# Patient Record
Sex: Male | Born: 1935 | Race: Black or African American | Hispanic: No | Smoking: Former smoker
Health system: Southern US, Community
[De-identification: ages and names within clinical notes are randomized; demographics above are authoritative.]

## PROBLEM LIST (undated history)

## (undated) DIAGNOSIS — E119 Type 2 diabetes mellitus without complications: Secondary | ICD-10-CM

## (undated) DIAGNOSIS — I10 Essential (primary) hypertension: Secondary | ICD-10-CM

## (undated) DIAGNOSIS — Z973 Presence of spectacles and contact lenses: Secondary | ICD-10-CM

---

## 1997-12-06 ENCOUNTER — Other Ambulatory Visit: Admission: RE | Admit: 1997-12-06 | Discharge: 1997-12-06 | Payer: Self-pay | Admitting: Family Medicine

## 2002-03-16 ENCOUNTER — Emergency Department (HOSPITAL_COMMUNITY): Admission: EM | Admit: 2002-03-16 | Discharge: 2002-03-16 | Payer: Self-pay | Admitting: Emergency Medicine

## 2014-07-31 ENCOUNTER — Emergency Department (HOSPITAL_COMMUNITY): Payer: Medicare Other

## 2014-07-31 ENCOUNTER — Encounter (HOSPITAL_COMMUNITY): Payer: Self-pay | Admitting: *Deleted

## 2014-07-31 ENCOUNTER — Inpatient Hospital Stay (HOSPITAL_COMMUNITY)
Admission: EM | Admit: 2014-07-31 | Discharge: 2014-08-06 | DRG: 480 | Disposition: A | Discharge status: Skilled Nursing Facility | Payer: Medicare Other | Attending: Internal Medicine | Admitting: Internal Medicine

## 2014-07-31 DIAGNOSIS — S72121A Displaced fracture of lesser trochanter of right femur, initial encounter for closed fracture: Secondary | ICD-10-CM | POA: Diagnosis not present

## 2014-07-31 DIAGNOSIS — E43 Unspecified severe protein-calorie malnutrition: Secondary | ICD-10-CM | POA: Diagnosis present

## 2014-07-31 DIAGNOSIS — W010XXA Fall on same level from slipping, tripping and stumbling without subsequent striking against object, initial encounter: Secondary | ICD-10-CM | POA: Diagnosis present

## 2014-07-31 DIAGNOSIS — D62 Acute posthemorrhagic anemia: Secondary | ICD-10-CM | POA: Diagnosis not present

## 2014-07-31 DIAGNOSIS — E876 Hypokalemia: Secondary | ICD-10-CM | POA: Diagnosis not present

## 2014-07-31 DIAGNOSIS — D649 Anemia, unspecified: Secondary | ICD-10-CM | POA: Diagnosis present

## 2014-07-31 DIAGNOSIS — I10 Essential (primary) hypertension: Secondary | ICD-10-CM | POA: Diagnosis present

## 2014-07-31 DIAGNOSIS — S7223XA Displaced subtrochanteric fracture of unspecified femur, initial encounter for closed fracture: Secondary | ICD-10-CM | POA: Diagnosis not present

## 2014-07-31 DIAGNOSIS — S72009A Fracture of unspecified part of neck of unspecified femur, initial encounter for closed fracture: Secondary | ICD-10-CM | POA: Diagnosis present

## 2014-07-31 DIAGNOSIS — S7291XA Unspecified fracture of right femur, initial encounter for closed fracture: Secondary | ICD-10-CM

## 2014-07-31 DIAGNOSIS — S72001A Fracture of unspecified part of neck of right femur, initial encounter for closed fracture: Secondary | ICD-10-CM

## 2014-07-31 DIAGNOSIS — W19XXXA Unspecified fall, initial encounter: Secondary | ICD-10-CM

## 2014-07-31 DIAGNOSIS — Z87891 Personal history of nicotine dependence: Secondary | ICD-10-CM

## 2014-07-31 DIAGNOSIS — E119 Type 2 diabetes mellitus without complications: Secondary | ICD-10-CM

## 2014-07-31 DIAGNOSIS — N390 Urinary tract infection, site not specified: Secondary | ICD-10-CM | POA: Diagnosis present

## 2014-07-31 HISTORY — DX: Essential (primary) hypertension: I10

## 2014-07-31 HISTORY — DX: Type 2 diabetes mellitus without complications: E11.9

## 2014-07-31 NOTE — ED Notes (Addendum)
Pt reports walking to mailbox when his shoe came off and he fell. Pt reports falling on R hip. Rotation noted. Pulses present

## 2014-07-31 NOTE — ED Notes (Signed)
Pt to ED from home via [facility] c/o R hip pain. Pt walked outside and tripped over his shoe. Pt fell onto R hip and felt something give. EMS gave fentanyl enroute. Pt hypertensive on arrive. Pt states he forgot to take his medications this morning so he took his blood pressure medications at 6pm

## 2014-08-01 ENCOUNTER — Encounter (HOSPITAL_COMMUNITY): Payer: Self-pay | Admitting: Internal Medicine

## 2014-08-01 ENCOUNTER — Encounter (HOSPITAL_COMMUNITY)
Admission: EM | Disposition: A | Discharge status: Skilled Nursing Facility | Payer: Self-pay | Source: Home / Self Care | Attending: Internal Medicine

## 2014-08-01 ENCOUNTER — Inpatient Hospital Stay (HOSPITAL_COMMUNITY): Payer: Medicare Other | Admitting: Certified Registered Nurse Anesthetist

## 2014-08-01 ENCOUNTER — Inpatient Hospital Stay (HOSPITAL_COMMUNITY): Payer: Medicare Other

## 2014-08-01 ENCOUNTER — Emergency Department (HOSPITAL_COMMUNITY): Payer: Medicare Other

## 2014-08-01 DIAGNOSIS — E876 Hypokalemia: Secondary | ICD-10-CM | POA: Diagnosis not present

## 2014-08-01 DIAGNOSIS — E43 Unspecified severe protein-calorie malnutrition: Secondary | ICD-10-CM | POA: Diagnosis present

## 2014-08-01 DIAGNOSIS — Z87891 Personal history of nicotine dependence: Secondary | ICD-10-CM | POA: Diagnosis not present

## 2014-08-01 DIAGNOSIS — N390 Urinary tract infection, site not specified: Secondary | ICD-10-CM | POA: Diagnosis present

## 2014-08-01 DIAGNOSIS — S72001A Fracture of unspecified part of neck of right femur, initial encounter for closed fracture: Secondary | ICD-10-CM

## 2014-08-01 DIAGNOSIS — S72121A Displaced fracture of lesser trochanter of right femur, initial encounter for closed fracture: Secondary | ICD-10-CM | POA: Diagnosis present

## 2014-08-01 DIAGNOSIS — I10 Essential (primary) hypertension: Secondary | ICD-10-CM | POA: Diagnosis present

## 2014-08-01 DIAGNOSIS — S72009A Fracture of unspecified part of neck of unspecified femur, initial encounter for closed fracture: Secondary | ICD-10-CM | POA: Diagnosis present

## 2014-08-01 DIAGNOSIS — S7223XA Displaced subtrochanteric fracture of unspecified femur, initial encounter for closed fracture: Secondary | ICD-10-CM | POA: Diagnosis present

## 2014-08-01 DIAGNOSIS — D62 Acute posthemorrhagic anemia: Secondary | ICD-10-CM | POA: Diagnosis not present

## 2014-08-01 DIAGNOSIS — W010XXA Fall on same level from slipping, tripping and stumbling without subsequent striking against object, initial encounter: Secondary | ICD-10-CM | POA: Diagnosis present

## 2014-08-01 DIAGNOSIS — E119 Type 2 diabetes mellitus without complications: Secondary | ICD-10-CM | POA: Diagnosis present

## 2014-08-01 HISTORY — PX: FEMUR IM NAIL: SHX1597

## 2014-08-01 HISTORY — PX: HIP FRACTURE SURGERY: SHX118

## 2014-08-01 LAB — COMPREHENSIVE METABOLIC PANEL
ALBUMIN: 3.5 g/dL (ref 3.5–5.2)
ALK PHOS: 127 U/L — AB (ref 39–117)
ALT: 27 U/L (ref 0–53)
ALT: 28 U/L (ref 0–53)
ANION GAP: 15 (ref 5–15)
AST: 36 U/L (ref 0–37)
AST: 32 U/L (ref 0–37)
Albumin: 3.8 g/dL (ref 3.5–5.2)
Alkaline Phosphatase: 144 U/L — ABNORMAL HIGH (ref 39–117)
Anion gap: 16 — ABNORMAL HIGH (ref 5–15)
BILIRUBIN TOTAL: 0.4 mg/dL (ref 0.3–1.2)
BUN: 16 mg/dL (ref 6–23)
BUN: 17 mg/dL (ref 6–23)
CALCIUM: 9.6 mg/dL (ref 8.4–10.5)
CHLORIDE: 90 meq/L — AB (ref 96–112)
CO2: 27 mEq/L (ref 19–32)
CO2: 28 meq/L (ref 19–32)
Calcium: 9.4 mg/dL (ref 8.4–10.5)
Chloride: 89 mEq/L — ABNORMAL LOW (ref 96–112)
Creatinine, Ser: 1.13 mg/dL (ref 0.50–1.35)
Creatinine, Ser: 1.09 mg/dL (ref 0.50–1.35)
GFR calc Af Amer: 65 mL/min — ABNORMAL LOW (ref >90)
GFR calc non Af Amer: 56 mL/min — ABNORMAL LOW (ref >90)
GFR calc non Af Amer: 59 mL/min — ABNORMAL LOW (ref >90)
GFR, EST AFRICAN AMERICAN: 68 mL/min — AB (ref >90)
GLUCOSE: 215 mg/dL — AB (ref 70–99)
Glucose, Bld: 211 mg/dL — ABNORMAL HIGH (ref 70–99)
POTASSIUM: 3.8 meq/L (ref 3.7–5.3)
POTASSIUM: 4.2 meq/L (ref 3.7–5.3)
SODIUM: 133 meq/L — AB (ref 137–147)
Sodium: 132 mEq/L — ABNORMAL LOW (ref 137–147)
TOTAL PROTEIN: 7.6 g/dL (ref 6.0–8.3)
Total Bilirubin: 0.4 mg/dL (ref 0.3–1.2)
Total Protein: 7 g/dL (ref 6.0–8.3)

## 2014-08-01 LAB — SURGICAL PCR SCREEN
MRSA, PCR: NEGATIVE
Staphylococcus aureus: NEGATIVE

## 2014-08-01 LAB — URINALYSIS, ROUTINE W REFLEX MICROSCOPIC
Bilirubin Urine: NEGATIVE
Glucose, UA: 100 mg/dL — AB
Ketones, ur: 40 mg/dL — AB
Nitrite: NEGATIVE
Protein, ur: NEGATIVE mg/dL
Specific Gravity, Urine: 1.013 (ref 1.005–1.030)
Urobilinogen, UA: 0.2 mg/dL (ref 0.0–1.0)
pH: 6.5 (ref 5.0–8.0)

## 2014-08-01 LAB — URINE MICROSCOPIC-ADD ON

## 2014-08-01 LAB — CBC WITH DIFFERENTIAL/PLATELET
Basophils Absolute: 0 10*3/uL (ref 0.0–0.1)
Basophils Absolute: 0 10*3/uL (ref 0.0–0.1)
Basophils Relative: 0 % (ref 0–1)
Basophils Relative: 0 % (ref 0–1)
EOS ABS: 0 10*3/uL (ref 0.0–0.7)
Eosinophils Absolute: 0 10*3/uL (ref 0.0–0.7)
Eosinophils Relative: 0 % (ref 0–5)
Eosinophils Relative: 0 % (ref 0–5)
HCT: 40.4 % (ref 39.0–52.0)
HCT: 44.6 % (ref 39.0–52.0)
Hemoglobin: 13.4 g/dL (ref 13.0–17.0)
Hemoglobin: 15.3 g/dL (ref 13.0–17.0)
LYMPHS ABS: 0.9 10*3/uL (ref 0.7–4.0)
LYMPHS PCT: 5 % — AB (ref 12–46)
Lymphocytes Relative: 6 % — ABNORMAL LOW (ref 12–46)
Lymphs Abs: 1.1 10*3/uL (ref 0.7–4.0)
MCH: 30.5 pg (ref 26.0–34.0)
MCH: 31.4 pg (ref 26.0–34.0)
MCHC: 33.2 g/dL (ref 30.0–36.0)
MCHC: 34.3 g/dL (ref 30.0–36.0)
MCV: 91.6 fL (ref 78.0–100.0)
MCV: 92 fL (ref 78.0–100.0)
Monocytes Absolute: 0.7 10*3/uL (ref 0.1–1.0)
Monocytes Absolute: 1.1 10*3/uL — ABNORMAL HIGH (ref 0.1–1.0)
Monocytes Relative: 4 % (ref 3–12)
Monocytes Relative: 7 % (ref 3–12)
NEUTROS PCT: 88 % — AB (ref 43–77)
Neutro Abs: 15.1 10*3/uL — ABNORMAL HIGH (ref 1.7–7.7)
Neutro Abs: 16 10*3/uL — ABNORMAL HIGH (ref 1.7–7.7)
Neutrophils Relative %: 90 % — ABNORMAL HIGH (ref 43–77)
PLATELETS: 205 10*3/uL (ref 150–400)
Platelets: 218 10*3/uL (ref 150–400)
RBC: 4.39 MIL/uL (ref 4.22–5.81)
RBC: 4.87 MIL/uL (ref 4.22–5.81)
RDW: 12.8 % (ref 11.5–15.5)
RDW: 12.9 % (ref 11.5–15.5)
WBC Morphology: INCREASED
WBC: 17.2 10*3/uL — AB (ref 4.0–10.5)
WBC: 17.8 10*3/uL — ABNORMAL HIGH (ref 4.0–10.5)

## 2014-08-01 LAB — GLUCOSE, CAPILLARY
Glucose-Capillary: 219 mg/dL — ABNORMAL HIGH (ref 70–99)
Glucose-Capillary: 189 mg/dL — ABNORMAL HIGH (ref 70–99)
Glucose-Capillary: 150 mg/dL — ABNORMAL HIGH (ref 70–99)
Glucose-Capillary: 170 mg/dL — ABNORMAL HIGH (ref 70–99)

## 2014-08-01 LAB — ABO/RH: ABO/RH(D): O POS

## 2014-08-01 LAB — VITAMIN D 25 HYDROXY (VIT D DEFICIENCY, FRACTURES): Vit D, 25-Hydroxy: 22 ng/mL — ABNORMAL LOW (ref 30–100)

## 2014-08-01 LAB — APTT: aPTT: 25 seconds (ref 24–37)

## 2014-08-01 LAB — PROTIME-INR
INR: 1.03 (ref 0.00–1.49)
Prothrombin Time: 13.7 s (ref 11.6–15.2)

## 2014-08-01 SURGERY — INSERTION, INTRAMEDULLARY ROD, FEMUR
Anesthesia: General | Site: Hip | Laterality: Right

## 2014-08-01 MED ORDER — METOCLOPRAMIDE HCL 10 MG PO TABS: 5.0000 mg | ORAL_TABLET | Freq: Three times a day (TID) | ORAL | Status: DC | PRN

## 2014-08-01 MED ORDER — CEFAZOLIN SODIUM-DEXTROSE 2-3 GM-% IV SOLR
INTRAVENOUS | Status: AC
  Filled 2014-08-01: qty 50

## 2014-08-01 MED ORDER — LIDOCAINE HCL (CARDIAC) 20 MG/ML IV SOLN
INTRAVENOUS | Status: AC
  Filled 2014-08-01: qty 5

## 2014-08-01 MED ORDER — HYDROCODONE-ACETAMINOPHEN 5-325 MG PO TABS
1.0000 | ORAL_TABLET | Freq: Four times a day (QID) | ORAL | Status: DC | PRN
  Administered 2014-08-02: 1 via ORAL
  Filled 2014-08-01: qty 1

## 2014-08-01 MED ORDER — ONDANSETRON HCL 4 MG PO TABS: 4.0000 mg | ORAL_TABLET | Freq: Four times a day (QID) | ORAL | Status: DC | PRN

## 2014-08-01 MED ORDER — NEOSTIGMINE METHYLSULFATE 10 MG/10ML IV SOLN
INTRAVENOUS | Status: AC
  Filled 2014-08-01: qty 1

## 2014-08-01 MED ORDER — INSULIN ASPART 100 UNIT/ML ~~LOC~~ SOLN
0.0000 [IU] | SUBCUTANEOUS | Status: DC
  Administered 2014-08-01 (×2): 3 [IU] via SUBCUTANEOUS
  Administered 2014-08-01 – 2014-08-02 (×4): 2 [IU] via SUBCUTANEOUS

## 2014-08-01 MED ORDER — PHENOL 1.4 % MT LIQD: 1.0000 | OROMUCOSAL | Status: DC | PRN

## 2014-08-01 MED ORDER — GLYCOPYRROLATE 0.2 MG/ML IJ SOLN
INTRAMUSCULAR | Status: AC
  Filled 2014-08-01: qty 3

## 2014-08-01 MED ORDER — OXYCODONE HCL 5 MG/5ML PO SOLN: 5.0000 mg | Freq: Once | ORAL | Status: DC | PRN

## 2014-08-01 MED ORDER — SODIUM CHLORIDE 0.9 % IV SOLN
INTRAVENOUS | Status: AC
  Administered 2014-08-01: 07:00:00 via INTRAVENOUS

## 2014-08-01 MED ORDER — LACTATED RINGERS IV SOLN: INTRAVENOUS | Status: DC

## 2014-08-01 MED ORDER — FENTANYL CITRATE 0.05 MG/ML IJ SOLN
INTRAMUSCULAR | Status: DC | PRN
  Administered 2014-08-01: 100 ug via INTRAVENOUS

## 2014-08-01 MED ORDER — MAGNESIUM CITRATE PO SOLN: 1.0000 | Freq: Once | ORAL | Status: AC | PRN

## 2014-08-01 MED ORDER — MORPHINE SULFATE 2 MG/ML IJ SOLN
0.5000 mg | INTRAMUSCULAR | Status: DC | PRN
  Administered 2014-08-01: 0.5 mg via INTRAVENOUS
  Filled 2014-08-01: qty 1

## 2014-08-01 MED ORDER — MORPHINE SULFATE 2 MG/ML IJ SOLN: 0.5000 mg | INTRAMUSCULAR | Status: DC | PRN

## 2014-08-01 MED ORDER — CHLORHEXIDINE GLUCONATE 4 % EX LIQD
60.0000 mL | Freq: Once | CUTANEOUS | Status: DC
  Filled 2014-08-01: qty 60

## 2014-08-01 MED ORDER — OXYCODONE HCL 5 MG PO TABS
5.0000 mg | ORAL_TABLET | ORAL | Status: DC | PRN
  Administered 2014-08-06: 10 mg via ORAL
  Filled 2014-08-01 (×3): qty 2

## 2014-08-01 MED ORDER — LACTATED RINGERS IV SOLN
INTRAVENOUS | Status: DC | PRN
  Administered 2014-08-01 (×2): via INTRAVENOUS

## 2014-08-01 MED ORDER — HYDROMORPHONE HCL 1 MG/ML IJ SOLN
0.5000 mg | INTRAMUSCULAR | Status: DC | PRN
Start: 2014-08-01 — End: 2014-08-01

## 2014-08-01 MED ORDER — ACETAMINOPHEN 650 MG RE SUPP: 650.0000 mg | Freq: Four times a day (QID) | RECTAL | Status: DC | PRN

## 2014-08-01 MED ORDER — OXYCODONE HCL 5 MG PO TABS: 5.0000 mg | ORAL_TABLET | ORAL | Status: DC | PRN

## 2014-08-01 MED ORDER — SODIUM CHLORIDE 0.9 % IV SOLN
INTRAVENOUS | Status: DC
  Administered 2014-08-01: 23:00:00 via INTRAVENOUS

## 2014-08-01 MED ORDER — CEFAZOLIN SODIUM-DEXTROSE 2-3 GM-% IV SOLR
2.0000 g | Freq: Four times a day (QID) | INTRAVENOUS | Status: AC
  Administered 2014-08-01 – 2014-08-02 (×3): 2 g via INTRAVENOUS
  Filled 2014-08-01 (×4): qty 50

## 2014-08-01 MED ORDER — HYDROCODONE-ACETAMINOPHEN 5-325 MG PO TABS: 1.0000 | ORAL_TABLET | Freq: Four times a day (QID) | ORAL | Status: DC | PRN

## 2014-08-01 MED ORDER — SORBITOL 70 % SOLN
30.0000 mL | Freq: Every day | Status: DC | PRN
  Administered 2014-08-06: 30 mL via ORAL
  Filled 2014-08-01: qty 30

## 2014-08-01 MED ORDER — LACTATED RINGERS IV SOLN
INTRAVENOUS | Status: DC
  Administered 2014-08-01: 13:00:00 via INTRAVENOUS

## 2014-08-01 MED ORDER — SENNA 8.6 MG PO TABS
1.0000 | ORAL_TABLET | Freq: Two times a day (BID) | ORAL | Status: DC
  Administered 2014-08-01 – 2014-08-06 (×10): 8.6 mg via ORAL
  Filled 2014-08-01 (×11): qty 1

## 2014-08-01 MED ORDER — SODIUM CHLORIDE 0.9 % IV SOLN: INTRAVENOUS | Status: DC

## 2014-08-01 MED ORDER — DEXTROSE 5 % IV SOLN
500.0000 mg | Freq: Four times a day (QID) | INTRAVENOUS | Status: DC | PRN
  Filled 2014-08-01: qty 5

## 2014-08-01 MED ORDER — PROPOFOL 10 MG/ML IV BOLUS
INTRAVENOUS | Status: AC
  Filled 2014-08-01: qty 20

## 2014-08-01 MED ORDER — NEOSTIGMINE METHYLSULFATE 10 MG/10ML IV SOLN
INTRAVENOUS | Status: DC | PRN
  Administered 2014-08-01: 4 mg via INTRAVENOUS

## 2014-08-01 MED ORDER — LIDOCAINE HCL (CARDIAC) 20 MG/ML IV SOLN
INTRAVENOUS | Status: DC | PRN
  Administered 2014-08-01: 90 mg via INTRAVENOUS

## 2014-08-01 MED ORDER — ENOXAPARIN SODIUM 40 MG/0.4ML ~~LOC~~ SOLN
40.0000 mg | SUBCUTANEOUS | Status: DC
  Administered 2014-08-02 – 2014-08-06 (×5): 40 mg via SUBCUTANEOUS
  Filled 2014-08-01 (×6): qty 0.4

## 2014-08-01 MED ORDER — KETOROLAC TROMETHAMINE 30 MG/ML IJ SOLN
30.0000 mg | Freq: Four times a day (QID) | INTRAMUSCULAR | Status: AC | PRN
Start: 2014-08-01 — End: 2014-08-03

## 2014-08-01 MED ORDER — PROMETHAZINE HCL 25 MG/ML IJ SOLN: 6.2500 mg | INTRAMUSCULAR | Status: DC | PRN

## 2014-08-01 MED ORDER — MORPHINE SULFATE 2 MG/ML IJ SOLN: 1.0000 mg | INTRAMUSCULAR | Status: DC | PRN

## 2014-08-01 MED ORDER — ONDANSETRON HCL 4 MG/2ML IJ SOLN
INTRAMUSCULAR | Status: DC | PRN
  Administered 2014-08-01: 4 mg via INTRAVENOUS

## 2014-08-01 MED ORDER — METOPROLOL TARTRATE 1 MG/ML IV SOLN
2.5000 mg | Freq: Three times a day (TID) | INTRAVENOUS | Status: DC
  Administered 2014-08-01 (×2): 2.5 mg via INTRAVENOUS
  Filled 2014-08-01 (×7): qty 5

## 2014-08-01 MED ORDER — CEFAZOLIN SODIUM-DEXTROSE 2-3 GM-% IV SOLR
INTRAVENOUS | Status: DC | PRN
  Administered 2014-08-01: 2 g via INTRAVENOUS

## 2014-08-01 MED ORDER — POLYETHYLENE GLYCOL 3350 17 G PO PACK
17.0000 g | PACK | Freq: Every day | ORAL | Status: DC | PRN
Start: 2014-08-01 — End: 2014-08-02

## 2014-08-01 MED ORDER — HYDROMORPHONE HCL 1 MG/ML IJ SOLN
0.2500 mg | INTRAMUSCULAR | Status: DC | PRN
  Administered 2014-08-01: 0.25 mg via INTRAVENOUS

## 2014-08-01 MED ORDER — METOCLOPRAMIDE HCL 5 MG/ML IJ SOLN: 5.0000 mg | Freq: Three times a day (TID) | INTRAMUSCULAR | Status: DC | PRN

## 2014-08-01 MED ORDER — ONDANSETRON HCL 4 MG/2ML IJ SOLN
INTRAMUSCULAR | Status: AC
  Filled 2014-08-01: qty 2

## 2014-08-01 MED ORDER — SODIUM CHLORIDE 0.9 % IV SOLN
Freq: Once | INTRAVENOUS | Status: AC
  Administered 2014-08-01: 75 mL/h via INTRAVENOUS

## 2014-08-01 MED ORDER — ROCURONIUM BROMIDE 100 MG/10ML IV SOLN
INTRAVENOUS | Status: DC | PRN
  Administered 2014-08-01: 40 mg via INTRAVENOUS

## 2014-08-01 MED ORDER — ACETAMINOPHEN 325 MG PO TABS
650.0000 mg | ORAL_TABLET | Freq: Four times a day (QID) | ORAL | Status: DC | PRN
Start: 2014-08-01 — End: 2014-08-06
  Administered 2014-08-03 – 2014-08-05 (×4): 650 mg via ORAL
  Filled 2014-08-01 (×5): qty 2

## 2014-08-01 MED ORDER — ONDANSETRON HCL 4 MG/2ML IJ SOLN: 4.0000 mg | Freq: Four times a day (QID) | INTRAMUSCULAR | Status: DC | PRN

## 2014-08-01 MED ORDER — METHOCARBAMOL 500 MG PO TABS
500.0000 mg | ORAL_TABLET | Freq: Four times a day (QID) | ORAL | Status: DC | PRN
  Administered 2014-08-02: 500 mg via ORAL
  Filled 2014-08-01: qty 1

## 2014-08-01 MED ORDER — CEFAZOLIN SODIUM-DEXTROSE 2-3 GM-% IV SOLR
2.0000 g | INTRAVENOUS | Status: AC
  Administered 2014-08-01: 2 g via INTRAVENOUS
  Filled 2014-08-01 (×2): qty 50

## 2014-08-01 MED ORDER — ALUM & MAG HYDROXIDE-SIMETH 200-200-20 MG/5ML PO SUSP: 30.0000 mL | ORAL | Status: DC | PRN

## 2014-08-01 MED ORDER — MENTHOL 3 MG MT LOZG
1.0000 | LOZENGE | OROMUCOSAL | Status: DC | PRN
  Administered 2014-08-02: 3 mg via ORAL
  Filled 2014-08-01: qty 9

## 2014-08-01 MED ORDER — GLYCOPYRROLATE 0.2 MG/ML IJ SOLN
INTRAMUSCULAR | Status: DC | PRN
  Administered 2014-08-01: 0.6 mg via INTRAVENOUS

## 2014-08-01 MED ORDER — DIAZEPAM 5 MG PO TABS
5.0000 mg | ORAL_TABLET | Freq: Four times a day (QID) | ORAL | Status: DC | PRN
  Administered 2014-08-01: 5 mg via ORAL
  Filled 2014-08-01: qty 1

## 2014-08-01 MED ORDER — FENTANYL CITRATE 0.05 MG/ML IJ SOLN
INTRAMUSCULAR | Status: AC
  Filled 2014-08-01: qty 5

## 2014-08-01 MED ORDER — 0.9 % SODIUM CHLORIDE (POUR BTL) OPTIME
TOPICAL | Status: DC | PRN
  Administered 2014-08-01: 1000 mL

## 2014-08-01 MED ORDER — ENOXAPARIN SODIUM 40 MG/0.4ML ~~LOC~~ SOLN: 40.0000 mg | Freq: Every day | SUBCUTANEOUS | Status: DC

## 2014-08-01 MED ORDER — ROCURONIUM BROMIDE 50 MG/5ML IV SOLN
INTRAVENOUS | Status: AC
  Filled 2014-08-01: qty 1

## 2014-08-01 MED ORDER — HYDROMORPHONE HCL 1 MG/ML IJ SOLN
INTRAMUSCULAR | Status: AC
  Filled 2014-08-01: qty 1

## 2014-08-01 MED ORDER — PHENYLEPHRINE HCL 10 MG/ML IJ SOLN
10.0000 mg | INTRAVENOUS | Status: DC | PRN
  Administered 2014-08-01: 20 ug/min via INTRAVENOUS

## 2014-08-01 MED ORDER — PROPOFOL 10 MG/ML IV BOLUS
INTRAVENOUS | Status: DC | PRN
  Administered 2014-08-01: 110 mg via INTRAVENOUS

## 2014-08-01 MED ORDER — OXYCODONE HCL 5 MG PO TABS: 5.0000 mg | ORAL_TABLET | Freq: Once | ORAL | Status: DC | PRN

## 2014-08-01 SURGICAL SUPPLY — 55 items
BIT DRILL LONG 4.0MM (BIT) IMPLANT
BIT DRILL SHORT 4.0 (BIT) IMPLANT
BNDG COHESIVE 4X5 TAN STRL (GAUZE/BANDAGES/DRESSINGS) ×2 IMPLANT
COVER PERINEAL POST (MISCELLANEOUS) ×2 IMPLANT
COVER SURGICAL LIGHT HANDLE (MISCELLANEOUS) ×2 IMPLANT
DRAPE C-ARM 42X72 X-RAY (DRAPES) ×2 IMPLANT
DRAPE C-ARMOR (DRAPES) ×2 IMPLANT
DRAPE INCISE IOBAN 66X45 STRL (DRAPES) ×2 IMPLANT
DRAPE ORTHO SPLIT 77X108 STRL (DRAPES) ×4
DRAPE PROXIMA HALF (DRAPES) ×4 IMPLANT
DRAPE SURG ORHT 6 SPLT 77X108 (DRAPES) ×2 IMPLANT
DRAPE U-SHAPE 47X51 STRL (DRAPES) ×2 IMPLANT
DRILL BIT LONG 4.0MM (BIT) ×2
DRILL BIT SHORT 4.0 (BIT) ×2
DRILL STEP  6.4 (BIT) ×1 IMPLANT
DRSG EMULSION OIL 3X3 NADH (GAUZE/BANDAGES/DRESSINGS) ×2 IMPLANT
DRSG MEPILEX BORDER 4X4 (GAUZE/BANDAGES/DRESSINGS) ×5 IMPLANT
DRSG MEPILEX BORDER 4X8 (GAUZE/BANDAGES/DRESSINGS) ×2 IMPLANT
DURAPREP 26ML APPLICATOR (WOUND CARE) ×2 IMPLANT
ELECT CAUTERY BLADE 6.4 (BLADE) ×2 IMPLANT
ELECT REM PT RETURN 9FT ADLT (ELECTROSURGICAL) ×2
ELECTRODE REM PT RTRN 9FT ADLT (ELECTROSURGICAL) ×1 IMPLANT
FACESHIELD WRAPAROUND (MASK) ×4 IMPLANT
FACESHIELD WRAPAROUND OR TEAM (MASK) ×2 IMPLANT
GLOVE BIOGEL PI IND STRL 7.0 (GLOVE) IMPLANT
GLOVE BIOGEL PI INDICATOR 7.0 (GLOVE) ×1
GLOVE SURG SS PI 7.0 STRL IVOR (GLOVE) ×1 IMPLANT
GLOVE SURG SYN 7.5  E (GLOVE) ×2
GLOVE SURG SYN 7.5 E (GLOVE) ×2 IMPLANT
GLOVE SURG SYN 7.5 PF PI (GLOVE) ×2 IMPLANT
GOWN STRL REIN XL XLG (GOWN DISPOSABLE) ×8 IMPLANT
GUIDE PIN 3.2MM (MISCELLANEOUS) ×2
GUIDE PIN ORTH 343X3.2XBRAD (MISCELLANEOUS) IMPLANT
GUIDE ROD 3.0 (MISCELLANEOUS) ×2
KIT BASIN OR (CUSTOM PROCEDURE TRAY) ×2 IMPLANT
KIT ROOM TURNOVER OR (KITS) ×2 IMPLANT
LINER BOOT UNIVERSAL DISP (MISCELLANEOUS) ×2 IMPLANT
MANIFOLD NEPTUNE II (INSTRUMENTS) ×2 IMPLANT
NAIL FEMORAL 11.5X38 (Nail) ×1 IMPLANT
NS IRRIG 1000ML POUR BTL (IV SOLUTION) ×2 IMPLANT
PACK GENERAL/GYN (CUSTOM PROCEDURE TRAY) ×2 IMPLANT
PAD ARMBOARD 7.5X6 YLW CONV (MISCELLANEOUS) ×4 IMPLANT
ROD GUIDE 3.0 (MISCELLANEOUS) IMPLANT
SCREW 6.4X100 (Screw) ×1 IMPLANT
SCREW 6.4X90 (Screw) ×1 IMPLANT
SCREW TRIGEN LOW PROF 5.0X32.5 (Screw) ×1 IMPLANT
SCREW TRIGEN LOW PROF 5.0X35 (Screw) ×1 IMPLANT
STAPLER VISISTAT 35W (STAPLE) IMPLANT
STOCKINETTE IMPERVIOUS 9X36 MD (GAUZE/BANDAGES/DRESSINGS) ×2 IMPLANT
SUT VIC AB 0 CT1 27 (SUTURE) ×2
SUT VIC AB 0 CT1 27XBRD ANBCTR (SUTURE) ×1 IMPLANT
SUT VIC AB 2-0 CT1 27 (SUTURE) ×4
SUT VIC AB 2-0 CT1 TAPERPNT 27 (SUTURE) ×2 IMPLANT
TRAY FOLEY CATH 14FR (SET/KITS/TRAYS/PACK) ×1 IMPLANT
WATER STERILE IRR 1000ML POUR (IV SOLUTION) ×4 IMPLANT

## 2014-08-01 NOTE — Consult Note (Signed)
ORTHOPAEDIC CONSULTATION  REQUESTING PHYSICIAN: Delfina Redwood, MD  Chief Complaint: right femur fx  HPI: [name] is a 80 y.o. male who complains of right femur fx s/p mechanical fall.  Denies LOC, neck pain, abd pain.  Lives independently.  Ortho consulted for right femur fx.  Past Medical History  Diagnosis Date  . Diabetes mellitus without complication   . Hypertension    Past Surgical History  Procedure Laterality Date  . No past surgeries     History   Social History  . Marital Status: Divorced    Spouse Name: N/A    Number of Children: N/A  . Years of Education: N/A   Social History Main Topics  . Smoking status: Former Research scientist (life sciences)  . Smokeless tobacco: None  . Alcohol Use: No  . Drug Use: No  . Sexual Activity: None   Other Topics Concern  . None   Social History Narrative   Family History  Problem Relation Age of Onset  . Other Neg Hx    No Known Allergies Prior to Admission medications   Medication Sig Start Date End Date Taking? Authorizing Provider  atenolol (TENORMIN) 25 MG tablet Take 25 mg by mouth daily.   Yes Historical Provider, MD  glyBURIDE (DIABETA) 5 MG tablet Take 5 mg by mouth daily.   Yes Historical Provider, MD  Multiple Vitamin (MULTIVITAMIN WITH MINERALS) TABS tablet Take 1 tablet by mouth daily.   Yes Historical Provider, MD   Dg Chest 1 View  08/01/2014   CLINICAL DATA:  Hypertension and diabetes.  Tripped and fall.  EXAM: CHEST - 1 VIEW  COMPARISON:  None.  FINDINGS: Normal heart size. Calcified atherosclerotic disease involves the aortic arch. No pleural effusion or edema. Lung volumes are low. No airspace consolidation.  IMPRESSION: 1. No acute findings. 2. Atherosclerotic disease noted.   Electronically Signed   By: Kerby Moors M.D.   On: 08/01/2014 01:00   Dg Hip Complete Right  08/01/2014   CLINICAL DATA:  Fall with right hip pain  EXAM: RIGHT HIP - COMPLETE 2+ VIEW  COMPARISON:  None.  FINDINGS: There is a spiral  fracture of the proximal right femur, extending from the upper diaphysis into the lesser trochanteric region. The lesser trochanter is displaced proximally. No fracture identified across the intertrochanteric line. Both hips are located. No evidence of pelvic ring fracture. Osteopenia which is profound. Diffuse arterial calcification.  IMPRESSION: Sub- and lesser trochanteric femur fracture on the right.   Electronically Signed   By: Jorje Guild M.D.   On: 08/01/2014 00:46    Positive ROS: All other systems have been reviewed and were otherwise negative with the exception of those mentioned in the HPI and as above.  Physical Exam: General: Alert, no acute distress Cardiovascular: No pedal edema Respiratory: No cyanosis, no use of accessory musculature GI: No organomegaly, abdomen is soft and non-tender Skin: No lesions in the area of chief complaint Neurologic: Sensation intact distally Psychiatric: Patient is competent for consent with normal mood and affect Lymphatic: No axillary or cervical lymphadenopathy  MUSCULOSKELETAL:  - painful ROM of hip - obvious shortening and rotation of leg - foot wwp, NVI  Assessment: Right femur fx  Plan: - NPO - plan for surgery today - discussed r/b/a, patient wishes to proceed - Based on history and fracture pattern this likely represents a fragility fracture. - Fragility fractures affect up to one half of women and one third of men after age 93 years  and occur in the setting of bone disorder such as osteoporosis or osteopenia and warrant appropriate work-up. - The following are general recommendations that may serve as an outline for an appropriate work-up:  1.) Obtain bone density measurement to confirm presumptive diagnosis, assess severity of osteoporosis and risk of future fracture, and use as baseline for monitoring treatment  2.) Obtain laboratory tests: CBC, ESR, serum calcium, creatinine, albumin,phosphate, alkaline phosphatase, liver  transaminases, protein electrophoresis, urinalysis, 25-hydroxyvitamin D.  3.) Exclude secondary causes of low bone mass and skeletal fragility (eg,multiple myeloma, lymphoma) as indicated.  4.) Obtain radiograph of thoracic and lumbar spine, particularly among individuals with back pain or height loss to assess presence of vertebral fractures  5.) Intermittent administration of recombinant human parathyroid hormone  6.) Optimize nutritional status using nutritional supplementation.  7.) Patient/family education to prevent future falls.  8.) Early mobilization and exercise program - exercise decreases the rate of bone loss and has been associated with decreased rate of fragility fractures   Thank you for the consult and the opportunity to see [name]. Eduard Roux, MD [place] 10:41 AM

## 2014-08-01 NOTE — Progress Notes (Signed)
Patient admitted after midnight. Not in room. Chart reviewed.  Crista Curborinna Dequarius Jeffries, MD [facility]

## 2014-08-01 NOTE — Op Note (Signed)
   Date of Surgery: 08/01/2014  INDICATIONS: Mr. [name] is a 80 y.o.-year-old male who was involved in a mechanical fall and sustained a right hip fracture (subtrochanteric-type). The risks and benefits of the procedure discussed with the patient prior to the procedure and all questions were answered; consent was obtained.  PREOPERATIVE DIAGNOSIS: right hip fracture (subrtrochanteric-type)   POSTOPERATIVE DIAGNOSIS: Same   PROCEDURE: Treatment of subtrochanteric fracture with intramedullary implant. CPT 226 309 [id number]   SURGEON: N. Glee ArvinMichael Xu, M.D.   ANESTHESIA: general   IV FLUIDS AND URINE: See anesthesia record   ESTIMATED BLOOD LOSS: 100  IMPLANTS: [employer] InterTAN 11.5 x 38, 100/90  DRAINS: None.   COMPLICATIONS: None.   DESCRIPTION OF PROCEDURE: The patient was brought to the operating room and placed supine on the operating table. The patient's leg had been signed prior to the procedure. The patient had the anesthesia placed by the anesthesiologist. The prep verification and incision time-outs were performed to confirm that this was the correct patient, site, side and location. The patient had an SCD on the opposite lower extremity. The patient did receive antibiotics prior to the incision and was re-dosed during the procedure as needed at indicated intervals. The patient was positioned on the fracture table with the table in traction and internal rotation to reduce the hip. The well leg was placed in a hemilithotomy position and all bony prominences were well-padded. The patient had the lower extremity prepped and draped in the standard surgical fashion. The incision was made 4 finger breadths superior to the greater trochanter. A guide pin was inserted into the tip of the greater trochanter under fluoroscopic guidance. An opening reamer was used to gain access to the femoral canal. The nail length was measured and inserted down the femoral canal to its proper depth. The appropriate  version of insertion for the lag screw was found under fluoroscopy. A pin was inserted up the femoral neck through the jig. Then, a second  pin was inserted inferior to the first pin. The length of thescrews were then measured. The recon screws were inserted as near to center-center in the head as possible. A second recon screw was placed. Two distal interlocking screws were placed by using the perfect circle technique. The wound was copiously irrigated with saline and the subcutaneous layer closed with 2.0 vicryl and the skin was reapproximated with staples. The wounds were cleaned and dried a final time and a sterile dressing was placed. The hip was taken through a range of motion at the end of the case under fluoroscopic imaging to visualize the approach-withdraw phenomenon and confirm implant length in the head. The patient was then awakened from anesthesia and taken to the recovery room in stable condition. All counts were correct at the end of the case.   POSTOPERATIVE PLAN: The patient will be weight bearing as tolerated and will return in 2 weeks for staple removal and the patient will receive DVT prophylaxis based on other medications, activity level, and risk ratio of bleeding to thrombosis.   Jesse ReelN. Michael Xu, MD [facility] [phone] 3:46 PM

## 2014-08-01 NOTE — Discharge Instructions (Signed)
° ° °  1. Change dressings as needed °2. May shower but keep incisions covered and dry °3. Take lovenox to prevent blood clots °4. Take stool softeners as needed °5. Take pain meds as needed ° °

## 2014-08-01 NOTE — Progress Notes (Signed)
Orthopedic Tech Progress Note Patient Details:  [name] [birth year] [id number]  Patient ID: Jesse AndersonSolomon Mathis, male   DOB: [birth year], 80 y.o.   MRN: [id number] Pt unable to use trapeze bar patient helper  Nikki DomCrawford, Carmen Vallecillo 08/01/2014, 8:31 AM

## 2014-08-01 NOTE — Anesthesia Procedure Notes (Signed)
Procedure Name: Intubation Date/Time: 08/01/2014 2:42 PM Performed by: Rise PatienceBELL, Nalani Andreen T Pre-anesthesia Checklist: Patient identified, Emergency Drugs available, Suction available and Patient being monitored Patient Re-evaluated:Patient Re-evaluated prior to inductionOxygen Delivery Method: Circle system utilized Preoxygenation: Pre-oxygenation with 100% oxygen Intubation Type: IV induction Ventilation: Mask ventilation without difficulty Laryngoscope Size: Miller and 2 Grade View: Grade I Tube type: Oral Tube size: 7.5 mm Number of attempts: 1 Airway Equipment and Method: Stylet Placement Confirmation: ETT inserted through vocal cords under direct vision,  positive ETCO2 and breath sounds checked- equal and bilateral Secured at: 22 cm Tube secured with: Tape Dental Injury: Teeth and Oropharynx as per pre-operative assessment

## 2014-08-01 NOTE — H&P (Signed)
H&P update  The surgical history has been reviewed and remains accurate without interval change.  The patient was re-examined and patient's physiologic condition has not changed significantly in the last 30 days. The condition still exists that makes this procedure necessary. The treatment plan remains the same, without new options for care.  No new pharmacological allergies or types of therapy has been initiated that would change the plan or the appropriateness of the plan.  The patient and/or family understand the potential benefits and risks.  Mayra ReelN. Michael Xu, MD 08/01/2014 11:32 AM

## 2014-08-01 NOTE — Anesthesia Postprocedure Evaluation (Signed)
  Anesthesia Post-op Note  Patient: Cherly AndersonSolomon Corvera  Procedure(s) Performed: Procedure(s): INTRAMEDULLARY (IM) NAIL FEMORAL (Right)  Patient Location: PACU  Anesthesia Type:General  Level of Consciousness: awake  Airway and Oxygen Therapy: Patient Spontanous Breathing  Post-op Pain: mild  Post-op Assessment: Post-op Vital signs reviewed, Patient's Cardiovascular Status Stable, Respiratory Function Stable, Patent Airway, No signs of Nausea or vomiting and Pain level controlled  Post-op Vital Signs: Reviewed and stable  Last Vitals:  Filed Vitals:   08/01/14 1706  BP:   Pulse:   Temp: 37.2 C  Resp:     Complications: No apparent anesthesia complications

## 2014-08-01 NOTE — ED Provider Notes (Signed)
Medical screening examination/treatment/procedure(s) were conducted as a shared visit with non-physician practitioner(s) and myself.  I personally evaluated the patient during the encounter.   EKG Interpretation   Date/Time:  Wednesday August 01 2014 00:27:30 EST Ventricular Rate:  71 PR Interval:  219 QRS Duration: 99 QT Interval:  440 QTC Calculation: 478 R Axis:   69 Text Interpretation:  Sinus rhythm Borderline prolonged PR interval  Borderline prolonged QT interval Baseline wander in lead(s) V2 No old  tracing to compare Confirmed by [facility]  [facility], TREY ([id number]) on 08/01/2014  1:20:21 AM      80 yo male with mechanical fall while trying to take out the trash.  Hit his right hip.  He denies any other injuries including head trauma or neck pain.  On exam, well appearing, nontoxic, not distressed, normal respiratory effort, normal perfusion, head atraumatic, neck nontender with full ROM, RLE is shortened and externally rotated.  Xray confirms hip fracture.  Ortho consult, admit.    Clinical Impression: 1. Hip fracture, right, closed, initial encounter   2. Jesse SallesFall       [name], MD 08/01/14 [phone]

## 2014-08-01 NOTE — ED Provider Notes (Signed)
CSN: [id number]     Arrival date & time 07/31/14  2318 History   First MD Initiated Contact with Patient 07/31/14 2324     Chief Complaint  Patient presents with  . Hip Pain     (Consider location/radiation/quality/duration/timing/severity/associated sxs/prior Treatment) HPI Comments: Patient states he was taking the trash to the curb where flip-flops when he turned he stepped out of his flip-flops causing him to stumble and landed on his right side presents to the emergency department via EMS with right hip pain.  Denies any other injuries.  Does have a history of hypertension, orally controlled diabetes followed by Dr. Kathe MarinerWayland Mackenzie  Patient is a 80 y.o. male presenting with hip pain. The history is provided by the patient.  Hip Pain This is a new problem. The current episode started today. The problem has been unchanged. Pertinent negatives include no chest pain, fever, nausea, neck pain, numbness, vomiting or weakness. The symptoms are aggravated by walking. He has tried nothing for the symptoms. The treatment provided no relief.    Past Medical History  Diagnosis Date  . Diabetes mellitus without complication   . Hypertension    History reviewed. No pertinent past surgical history. History reviewed. No pertinent family history. History  Substance Use Topics  . Smoking status: Former Games developermoker  . Smokeless tobacco: Not on file  . Alcohol Use: No    Review of Systems  Constitutional: Negative for fever.  Respiratory: Negative for shortness of breath.   Cardiovascular: Negative for chest pain.  Gastrointestinal: Negative for nausea and vomiting.  Musculoskeletal: Negative for neck pain.  Skin: Negative for wound.  Neurological: Negative for dizziness, weakness and numbness.  All other systems reviewed and are negative.     Allergies  Review of patient's allergies indicates no known allergies.  Home Medications   Prior to Admission medications   Medication Sig  Start Date End Date Taking? Authorizing Provider  atenolol (TENORMIN) 25 MG tablet Take 25 mg by mouth daily.   Yes Historical Provider, MD  glyBURIDE (DIABETA) 5 MG tablet Take 5 mg by mouth daily.   Yes Historical Provider, MD  Multiple Vitamin (MULTIVITAMIN WITH MINERALS) TABS tablet Take 1 tablet by mouth daily.   Yes Historical Provider, MD   BP 132/67 mmHg  Pulse 78  Temp(Src) 97.9 F (36.6 C) (Oral)  Resp 15  SpO2 98% Physical Exam  Constitutional: He appears well-nourished. No distress.  HENT:  Head: Normocephalic and atraumatic.  Mouth/Throat: Oropharynx is clear and moist.  Eyes: Pupils are equal, round, and reactive to light.  Neck: Normal range of motion. Neck supple.  Cardiovascular: Normal rate and regular rhythm.   Pulmonary/Chest: Effort normal and breath sounds normal.  Abdominal: Soft. He exhibits no distension.  Musculoskeletal: He exhibits tenderness. He exhibits no edema.       Legs: Neurological: He is alert.  Skin: Skin is warm. There is erythema.  Nursing note and vitals reviewed.   ED Course  Procedures (including critical care time) Labs Review Labs Reviewed  CBC WITH DIFFERENTIAL - Abnormal; Notable for the following:    WBC 17.2 (*)    Neutrophils Relative % 88 (*)    Neutro Abs 15.1 (*)    Lymphocytes Relative 5 (*)    Monocytes Absolute 1.1 (*)    All other components within normal limits  COMPREHENSIVE METABOLIC PANEL - Abnormal; Notable for the following:    Sodium 133 (*)    Chloride 89 (*)    Glucose, Bld  211 (*)    Alkaline Phosphatase 144 (*)    GFR calc non Af Amer 56 (*)    GFR calc Af Amer 65 (*)    Anion gap 16 (*)    All other components within normal limits  APTT  URINALYSIS, ROUTINE W REFLEX MICROSCOPIC    Imaging Review Dg Chest 1 View  08/01/2014   CLINICAL DATA:  Hypertension and diabetes.  Tripped and fall.  EXAM: CHEST - 1 VIEW  COMPARISON:  None.  FINDINGS: Normal heart size. Calcified atherosclerotic disease  involves the aortic arch. No pleural effusion or edema. Lung volumes are low. No airspace consolidation.  IMPRESSION: 1. No acute findings. 2. Atherosclerotic disease noted.   Electronically Signed   By: Signa Kellaylor  Stroud M.D.   On: 08/01/2014 01:00   Dg Hip Complete Right  08/01/2014   CLINICAL DATA:  Fall with right hip pain  EXAM: RIGHT HIP - COMPLETE 2+ VIEW  COMPARISON:  None.  FINDINGS: There is a spiral fracture of the proximal right femur, extending from the upper diaphysis into the lesser trochanteric region. The lesser trochanter is displaced proximally. No fracture identified across the intertrochanteric line. Both hips are located. No evidence of pelvic ring fracture. Osteopenia which is profound. Diffuse arterial calcification.  IMPRESSION: Sub- and lesser trochanteric femur fracture on the right.   Electronically Signed   By: Tiburcio PeaJonathan  Watts M.D.   On: 08/01/2014 00:46     EKG Interpretation   Date/Time:  Wednesday August 01 2014 00:27:30 EST Ventricular Rate:  71 PR Interval:  219 QRS Duration: 99 QT Interval:  440 QTC Calculation: 478 R Axis:   69 Text Interpretation:  Sinus rhythm Borderline prolonged PR interval  Borderline prolonged QT interval Baseline wander in lead(s) V2 No old  tracing to compare Confirmed by [facility]  [facility], TREY ([id number]) on 08/01/2014  1:20:21 AM     Alphonsa OverallBrad Dixon, PA for Dr. Alda BertholdNourse, evaluated patient.  He will be taken to the OR tomorrow for repair request hospitalist admission. MDM   Final diagnoses:  Fall  Hip fracture, right, closed, initial encounter         Arman FilterGail K Wiley Magan, NP 08/01/14 0248  April K Palumbo-Rasch, MD 08/01/14 [phone]

## 2014-08-01 NOTE — Transfer of Care (Signed)
Immediate Anesthesia Transfer of Care Note  Patient: Jesse AndersonSolomon Mathis  Procedure(s) Performed: Procedure(s): INTRAMEDULLARY (IM) NAIL FEMORAL (Right)  Patient Location: PACU  Anesthesia Type:General  Level of Consciousness: awake and alert   Airway & Oxygen Therapy: Patient Spontanous Breathing and Patient connected to nasal cannula oxygen  Post-op Assessment: Report given to PACU RN, Post -op Vital signs reviewed and stable and Patient moving all extremities X 4  Post vital signs: Reviewed and stable  Complications: No apparent anesthesia complications

## 2014-08-01 NOTE — Progress Notes (Signed)
INITIAL NUTRITION ASSESSMENT  Pt meets criteria for SEVERE MALNUTRITION in the context of chronic illness as evidenced by severe fat and muscle mass loss.  DOCUMENTATION CODES Per approved criteria  -Severe malnutrition in the context of chronic illness   INTERVENTION: Once diet advances, encourage adequate PO intake. If PO intake becomes poor, RD to order supplements.  NUTRITION DIAGNOSIS: Increased nutrient needs related to surgery as evidenced by estimated nutrition needs.   Goal: Pt to meet >/= 90% of their estimated nutrition needs   Monitor:  Diet advancement, weight trends, labs, I/O's  Reason for Assessment: MD consult  80 y.o. male  Admitting Dx: Hip fracture  ASSESSMENT: Pt with history of diabetes mellitus and hypertension had a fall after tripping. Patient states his shoe slipped and he fell. Denies hitting his head onto the floor or losing consciousness. Denies any chest pain or shortness of breath or palpitations. In the ER x-rays revealed a right hip fracture.  Pt is currently NPO for surgery. Pt reports having a good appetite currently and prior to admission at home usually eating 2 full meals and snacks in between. Pt reports no recent weight loss with his usual body weight of ~120 lbs. Pt reports he does not needs any additional snacks or supplements as he suspects PO intake will be adequate, however if PO intake is poor, RD to order supplements/snacks. Noted pt with severe fat and muscle mass loss.  Nutrition Focused Physical Exam:  Subcutaneous Fat:  Orbital Region: N/A Upper Arm Region: Moderate depletion Thoracic and Lumbar Region: Severe depletion  Muscle:  Temple Region: Moderate depletion Clavicle Bone Region: Severe depletion Clavicle and Acromion Bone Region: Severe depletion Scapular Bone Region: N/A Dorsal Hand: N/A Patellar Region: Moderate depletion Anterior Thigh Region: Moderate depletion Posterior Calf Region: Moderate  depletion  Edema: none  Labs: Low sodium, chloride, and GFR. High Alkaline phosphatase.  Height: Ht Readings from Last 1 Encounters:  No data found for Ht  Pt reports 5 feet 7 inches  Weight: Wt Readings from Last 1 Encounters:  No data found for Wt  Pt reports 120 lbs.  Ideal Body Weight: 148 lbs  % Ideal Body Weight: 81%  Wt Readings from Last 10 Encounters:  No data found for Wt    Usual Body Weight: 120 lbs  % Usual Body Weight: 100%  BMI:  Body Mass Index: 18.87 kg/(m^2)  Estimated Nutritional Needs: Kcal: 1650-1850 Protein: 65-80 grams Fluid: 1.65-1.85 L/day  Skin: intact  Diet Order: Diet NPO time specified Diet NPO time specified  EDUCATION NEEDS: -No education needs identified at this time  No intake or output data in the 24 hours ending 08/01/14 0914  Last BM: 11/24  Labs:   Recent Labs Lab 08/01/14 0034 08/01/14 0531  NA 133* 132*  K 3.8 4.2  CL 89* 90*  CO2 28 27  BUN 16 17  CREATININE 1.13 1.09  CALCIUM 9.6 9.4  GLUCOSE 211* 215*    CBG (last 3)   Recent Labs  08/01/14 0533  GLUCAP 219*    Scheduled Meds: . chlorhexidine  60 mL Topical Once  . insulin aspart  0-9 Units Subcutaneous 6 times per day  . metoprolol  2.5 mg Intravenous 3 times per day    Continuous Infusions: . sodium chloride 50 mL/hr at 08/01/14 [id number]    Past Medical History  Diagnosis Date  . Diabetes mellitus without complication   . Hypertension     Past Surgical History  Procedure Laterality Date  .  No past surgeries      Marijean NiemannStephanie La, MS, RD, LDN Pager # [phone] After hours/ weekend pager # [phone]

## 2014-08-01 NOTE — Consult Note (Addendum)
[name] is an 80 y.o. male.    Chief Complaint: right hip pain  HPI: 80 y/o pleasant male walking with flip flops earlier tonight and missed a step causing a fall. C/o immediate pain to right hip and inability to weight bear. Denies any numbness or tingling distally. Very minimal pain to right hip currently. Denies any other injuries, LOC, chest pain or syncope. Otherwise doing well  PCP:  [name], MD  PMH: Past Medical History  Diagnosis Date  . Diabetes mellitus without complication   . Hypertension     PSH: History reviewed. No pertinent past surgical history.  Social History:  reports that he has quit smoking. He does not have any smokeless tobacco history on file. He reports that he does not drink alcohol or use illicit drugs.  Allergies:  No Known Allergies  Medications: No current facility-administered medications for this encounter.   Current Outpatient Prescriptions  Medication Sig Dispense Refill  . atenolol (TENORMIN) 25 MG tablet Take 25 mg by mouth daily.    Marland Kitchen glyBURIDE (DIABETA) 5 MG tablet Take 5 mg by mouth daily.    . Multiple Vitamin (MULTIVITAMIN WITH MINERALS) TABS tablet Take 1 tablet by mouth daily.      Results for orders placed or performed during the hospital encounter of 07/31/14 (from the past 48 hour(s))  CBC with Differential     Status: Abnormal   Collection Time: 08/01/14 12:34 AM  Result Value Ref Range   WBC 17.2 (H) 4.0 - 10.5 K/uL   RBC 4.87 4.22 - 5.81 MIL/uL   Hemoglobin 15.3 13.0 - 17.0 g/dL   HCT 44.6 39.0 - 52.0 %   MCV 91.6 78.0 - 100.0 fL   MCH 31.4 26.0 - 34.0 pg   MCHC 34.3 30.0 - 36.0 g/dL   RDW 12.9 11.5 - 15.5 %   Platelets 205 150 - 400 K/uL   Neutrophils Relative % 88 (H) 43 - 77 %   Neutro Abs 15.1 (H) 1.7 - 7.7 K/uL   Lymphocytes Relative 5 (L) 12 - 46 %   Lymphs Abs 0.9 0.7 - 4.0 K/uL   Monocytes Relative 7 3 - 12 %   Monocytes Absolute 1.1 (H) 0.1 - 1.0 K/uL   Eosinophils Relative 0 0 - 5 %   Eosinophils Absolute 0.0 0.0 - 0.7 K/uL   Basophils Relative 0 0 - 1 %   Basophils Absolute 0.0 0.0 - 0.1 K/uL  Comprehensive metabolic panel     Status: Abnormal   Collection Time: 08/01/14 12:34 AM  Result Value Ref Range   Sodium 133 (L) 137 - 147 mEq/L   Potassium 3.8 3.7 - 5.3 mEq/L   Chloride 89 (L) 96 - 112 mEq/L   CO2 28 19 - 32 mEq/L   Glucose, Bld 211 (H) 70 - 99 mg/dL   BUN 16 6 - 23 mg/dL   Creatinine, Ser 1.13 0.50 - 1.35 mg/dL   Calcium 9.6 8.4 - 10.5 mg/dL   Total Protein 7.6 6.0 - 8.3 g/dL   Albumin 3.8 3.5 - 5.2 g/dL   AST 36 0 - 37 U/L   ALT 28 0 - 53 U/L   Alkaline Phosphatase 144 (H) 39 - 117 U/L   Total Bilirubin 0.4 0.3 - 1.2 mg/dL   GFR calc non Af Amer 56 (L) >90 mL/min   GFR calc Af Amer 65 (L) >90 mL/min    Comment: (NOTE) The eGFR has been calculated using the CKD EPI equation. This  calculation has not been validated in all clinical situations. eGFR's persistently <90 mL/min signify possible Chronic Kidney Disease.    Anion gap 16 (H) 5 - 15  APTT     Status: None   Collection Time: 08/01/14 12:34 AM  Result Value Ref Range   aPTT 25 24 - 37 seconds   Dg Chest 1 View  08/01/2014   CLINICAL DATA:  Hypertension and diabetes.  Tripped and fall.  EXAM: CHEST - 1 VIEW  COMPARISON:  None.  FINDINGS: Normal heart size. Calcified atherosclerotic disease involves the aortic arch. No pleural effusion or edema. Lung volumes are low. No airspace consolidation.  IMPRESSION: 1. No acute findings. 2. Atherosclerotic disease noted.   Electronically Signed   By: Kerby Moors M.D.   On: 08/01/2014 01:00   Dg Hip Complete Right  08/01/2014   CLINICAL DATA:  Fall with right hip pain  EXAM: RIGHT HIP - COMPLETE 2+ VIEW  COMPARISON:  None.  FINDINGS: There is a spiral fracture of the proximal right femur, extending from the upper diaphysis into the lesser trochanteric region. The lesser trochanter is displaced proximally. No fracture identified across the  intertrochanteric line. Both hips are located. No evidence of pelvic ring fracture. Osteopenia which is profound. Diffuse arterial calcification.  IMPRESSION: Sub- and lesser trochanteric femur fracture on the right.   Electronically Signed   By: Jorje Guild M.D.   On: 08/01/2014 00:46    ROS: ROS Unable to ambulate currently due to hip pain Otherwise ROS negative  Physical Exam: Alert and appropriate 80 y/o male in no acute distress Cervical spine with full rom no tenderness Bilateral upper extremities with full rom no deformity nv intact distally Right lower extremity with obvious shortening and external rotation nv intact distally No signs of open injury Left lower extremity with full rom no deformity No rashes or edema distally Physical Exam    X-rays - subtroch femur fracture of the right hip  Assessment/Plan Assessment: subtroch femur fracture of the right hip   Plan: Medicine to admit Plan for surgical management of the the femur fracture once schedule permits Pain control as needed Strict bedrest  I have seen and examined the patient and agree with the above assessment and plan.  I will contact one of my partners to take over his care.  He will need IM Nailing of the fracture.

## 2014-08-01 NOTE — H&P (Signed)
[facility] History and Physical  Jesse AndersonSolomon Mathis ZOX:[id number] DOB: [birth year] DOA: 07/31/2014  Referring physician: ER physician. PCP: [name], [name], MD   Chief Complaint: Fall with right hip pain.  HPI: [name] is a 80 y.o. male with history of diabetes mellitus and hypertension had a fall after tripping. Patient states his shoe slipped and he fell. Denies hitting his head onto the floor or losing consciousness. Denies any chest pain or shortness of breath or palpitations. In the ER x-rays revealed a right hip fracture and on-call surgeon Dr. Arthur HolmsNorrins orthopedics was consulted. Patient at this time is being admitted for further management of his right hip fracture. On exam patient is not in acute distress.   Review of Systems: As presented in the history of presenting illness, rest negative.  Past Medical History  Diagnosis Date  . Diabetes mellitus without complication   . Hypertension    Past Surgical History  Procedure Laterality Date  . No past surgeries     Social History:  reports that he has quit smoking. He does not have any smokeless tobacco history on file. He reports that he does not drink alcohol or use illicit drugs. Where does patient live home. Can patient participate in ADLs? Yes.  No Known Allergies  Family History:  Family History  Problem Relation Age of Onset  . Other Neg Hx       Prior to Admission medications   Medication Sig Start Date End Date Taking? Authorizing Provider  atenolol (TENORMIN) 25 MG tablet Take 25 mg by mouth daily.   Yes Historical Provider, MD  glyBURIDE (DIABETA) 5 MG tablet Take 5 mg by mouth daily.   Yes Historical Provider, MD  Multiple Vitamin (MULTIVITAMIN WITH MINERALS) TABS tablet Take 1 tablet by mouth daily.   Yes Historical Provider, MD    Physical Exam: Filed Vitals:   08/01/14 0215 08/01/14 0300 08/01/14 0315 08/01/14 0354  BP: 132/67 126/63 124/66 143/66  Pulse: 78 79 83 86  Temp:    98.7 F  (37.1 C)  TempSrc:    Oral  Resp: 15 15 14 15   SpO2: 98% 99% 98% 99%     General:  Moderately built and nourished.  Eyes: Anicteric no pallor.  ENT: No discharge from the ears eyes nose mouth.  Neck: No mass felt.  Cardiovascular: S1-S2 heard.  Respiratory: No rhonchi or crepitations.  Abdomen: Soft nontender bowel sounds present.  Skin: No rash.  Musculoskeletal: Pain on moving right hip.  Psychiatric: Appears normal.  Neurologic: Alert awake oriented to time place and person. Moves all extremities.  Labs on Admission:  Basic Metabolic Panel:  Recent Labs Lab 08/01/14 0034  NA 133*  K 3.8  CL 89*  CO2 28  GLUCOSE 211*  BUN 16  CREATININE 1.13  CALCIUM 9.6   Liver Function Tests:  Recent Labs Lab 08/01/14 0034  AST 36  ALT 28  ALKPHOS 144*  BILITOT 0.4  PROT 7.6  ALBUMIN 3.8   No results for input(s): LIPASE, AMYLASE in the last 168 hours. No results for input(s): AMMONIA in the last 168 hours. CBC:  Recent Labs Lab 08/01/14 0034  WBC 17.2*  NEUTROABS 15.1*  HGB 15.3  HCT 44.6  MCV 91.6  PLT 205   Cardiac Enzymes: No results for input(s): CKTOTAL, CKMB, CKMBINDEX, TROPONINI in the last 168 hours.  BNP (last 3 results) No results for input(s): PROBNP in the last 8760 hours. CBG: No results for input(s): GLUCAP in the last 168  hours.  Radiological Exams on Admission: Dg Chest 1 View  08/01/2014   CLINICAL DATA:  Hypertension and diabetes.  Tripped and fall.  EXAM: CHEST - 1 VIEW  COMPARISON:  None.  FINDINGS: Normal heart size. Calcified atherosclerotic disease involves the aortic arch. No pleural effusion or edema. Lung volumes are low. No airspace consolidation.  IMPRESSION: 1. No acute findings. 2. Atherosclerotic disease noted.   Electronically Signed   By: Signa Kellaylor  Stroud M.D.   On: 08/01/2014 01:00   Dg Hip Complete Right  08/01/2014   CLINICAL DATA:  Fall with right hip pain  EXAM: RIGHT HIP - COMPLETE 2+ VIEW  COMPARISON:   None.  FINDINGS: There is a spiral fracture of the proximal right femur, extending from the upper diaphysis into the lesser trochanteric region. The lesser trochanter is displaced proximally. No fracture identified across the intertrochanteric line. Both hips are located. No evidence of pelvic ring fracture. Osteopenia which is profound. Diffuse arterial calcification.  IMPRESSION: Sub- and lesser trochanteric femur fracture on the right.   Electronically Signed   By: Tiburcio PeaJonathan  Watts M.D.   On: 08/01/2014 00:46    EKG: Independently reviewed. Normal sinus rhythm.  Assessment/Plan Active Problems:   Hip fracture   Diabetes mellitus type 2, controlled   Essential hypertension   1. Right hip fracture status post mechanical fall - from medical standpoint a few patient looks medically stable. Patient will be kept nothing by mouth in anticipation of possible surgery. I have discussed with Dr. Arthur HolmsNorrins on-call surgeon. Patient will be placed on pain relief medications. 2. Hypertension - patient is on at all. Since patient is nothing by mouth I have placed patient on metoprolol 2.5 mg IV every 8 hourly which can be [facility] take orally. 3. Diabetes mellitus type 2 - patient's blood sugar was slightly elevated. Since patient isn't to have placed patient on CBGs and insulin coverage. Continue patient's home dose of glyburide when patient comes back after surgery. 4. Leukocytosis - probably reactionary. Patient did have some productive cough while I was examining. Chest x-ray is unremarkable and patient is afebrile. Follow UA and urine cultures. Follow CBC.    Code Status: Full code.  Family Communication: None.  Disposition Plan: Admit to inpatient.    KAKRAKANDY,ARSHAD N. [facility] Pager [phone].  If 7PM-7AM, please contact night-coverage [email or web address] Password [email or web address] 08/01/2014, 4:27 AM

## 2014-08-01 NOTE — Anesthesia Preprocedure Evaluation (Addendum)
Anesthesia Evaluation  Patient identified by MRN, date of birth, ID band Patient awake    Reviewed: Allergy & Precautions, H&P , NPO status , Patient's Chart, lab work & pertinent test results, reviewed documented beta blocker date and time   History of Anesthesia Complications Negative for: history of anesthetic complications  Airway Mallampati: I  TM Distance: >3 FB Neck ROM: Full    Dental  (+) Dental Advisory Given, Edentulous Upper, Poor Dentition   Pulmonary former smoker,    Pulmonary exam normal       Cardiovascular hypertension, Pt. on medications     Neuro/Psych negative neurological ROS  negative psych ROS   GI/Hepatic negative GI ROS, Neg liver ROS,   Endo/Other  diabetes, Type 2, Oral Hypoglycemic Agents  Renal/GU      Musculoskeletal   Abdominal   Peds  Hematology   Anesthesia Other Findings   Reproductive/Obstetrics                           Anesthesia Physical Anesthesia Plan  ASA: III  Anesthesia Plan: General   Post-op Pain Management:    Induction: Intravenous  Airway Management Planned: Oral ETT  Additional Equipment:   Intra-op Plan:   Post-operative Plan: Extubation in OR  Informed Consent: I have reviewed the patients History and Physical, chart, labs and discussed the procedure including the risks, benefits and alternatives for the proposed anesthesia with the patient or authorized representative who has indicated his/her understanding and acceptance.   Dental advisory given  Plan Discussed with: CRNA, Anesthesiologist and Surgeon  Anesthesia Plan Comments:        Anesthesia Quick Evaluation

## 2014-08-02 ENCOUNTER — Encounter (HOSPITAL_COMMUNITY): Payer: Self-pay | Admitting: General Practice

## 2014-08-02 DIAGNOSIS — N390 Urinary tract infection, site not specified: Secondary | ICD-10-CM | POA: Diagnosis present

## 2014-08-02 DIAGNOSIS — E43 Unspecified severe protein-calorie malnutrition: Secondary | ICD-10-CM | POA: Diagnosis present

## 2014-08-02 DIAGNOSIS — D649 Anemia, unspecified: Secondary | ICD-10-CM

## 2014-08-02 LAB — CBC
HEMATOCRIT: 25.8 % — AB (ref 39.0–52.0)
Hemoglobin: 9 g/dL — ABNORMAL LOW (ref 13.0–17.0)
MCH: 31.8 pg (ref 26.0–34.0)
MCHC: 34.9 g/dL (ref 30.0–36.0)
MCV: 91.2 fL (ref 78.0–100.0)
PLATELETS: 167 10*3/uL (ref 150–400)
RBC: 2.83 MIL/uL — AB (ref 4.22–5.81)
RDW: 12.8 % (ref 11.5–15.5)
WBC: 12.8 10*3/uL — ABNORMAL HIGH (ref 4.0–10.5)

## 2014-08-02 LAB — BASIC METABOLIC PANEL
ANION GAP: 13 (ref 5–15)
BUN: 19 mg/dL (ref 6–23)
CALCIUM: 8.2 mg/dL — AB (ref 8.4–10.5)
CO2: 26 meq/L (ref 19–32)
CREATININE: 1.25 mg/dL (ref 0.50–1.35)
Chloride: 96 mEq/L (ref 96–112)
GFR calc Af Amer: 57 mL/min — ABNORMAL LOW (ref >90)
GFR calc non Af Amer: 50 mL/min — ABNORMAL LOW (ref >90)
Glucose, Bld: 139 mg/dL — ABNORMAL HIGH (ref 70–99)
Potassium: 3.9 mEq/L (ref 3.7–5.3)
Sodium: 135 mEq/L — ABNORMAL LOW (ref 137–147)

## 2014-08-02 LAB — URINE CULTURE: Colony Count: >=100000

## 2014-08-02 LAB — GLUCOSE, CAPILLARY
GLUCOSE-CAPILLARY: 126 mg/dL — AB (ref 70–99)
GLUCOSE-CAPILLARY: 186 mg/dL — AB (ref 70–99)
Glucose-Capillary: 159 mg/dL — ABNORMAL HIGH (ref 70–99)
Glucose-Capillary: 214 mg/dL — ABNORMAL HIGH (ref 70–99)
Glucose-Capillary: 152 mg/dL — ABNORMAL HIGH (ref 70–99)
Glucose-Capillary: 151 mg/dL — ABNORMAL HIGH (ref 70–99)

## 2014-08-02 MED ORDER — INSULIN ASPART 100 UNIT/ML ~~LOC~~ SOLN
0.0000 [IU] | Freq: Three times a day (TID) | SUBCUTANEOUS | Status: DC
  Administered 2014-08-02: 3 [IU] via SUBCUTANEOUS
  Administered 2014-08-02: 2 [IU] via SUBCUTANEOUS
  Administered 2014-08-03: 3 [IU] via SUBCUTANEOUS
  Administered 2014-08-03 – 2014-08-05 (×4): 1 [IU] via SUBCUTANEOUS

## 2014-08-02 MED ORDER — ATENOLOL 25 MG PO TABS
25.0000 mg | ORAL_TABLET | Freq: Every day | ORAL | Status: DC
  Administered 2014-08-03 – 2014-08-06 (×4): 25 mg via ORAL
  Filled 2014-08-02 (×5): qty 1

## 2014-08-02 MED ORDER — SULFAMETHOXAZOLE-TRIMETHOPRIM 800-160 MG PO TABS
1.0000 | ORAL_TABLET | Freq: Two times a day (BID) | ORAL | Status: AC
  Administered 2014-08-02 – 2014-08-04 (×6): 1 via ORAL
  Filled 2014-08-02 (×6): qty 1

## 2014-08-02 MED ORDER — ADULT MULTIVITAMIN W/MINERALS CH
1.0000 | ORAL_TABLET | Freq: Every day | ORAL | Status: DC
  Administered 2014-08-02 – 2014-08-06 (×5): 1 via ORAL
  Filled 2014-08-02 (×5): qty 1

## 2014-08-02 MED ORDER — POLYETHYLENE GLYCOL 3350 17 G PO PACK
17.0000 g | PACK | Freq: Every day | ORAL | Status: DC
  Administered 2014-08-02 – 2014-08-06 (×5): 17 g via ORAL
  Filled 2014-08-02 (×4): qty 1

## 2014-08-02 NOTE — Plan of Care (Signed)
Problem: Phase II Progression Outcomes Goal: CMS/Neurovascular status WDL Outcome: Completed/Met Date Met:  08/02/14 Goal: Tolerating diet Outcome: Completed/Met Date Met:  08/02/14 Goal: Bed to chair Outcome: Progressing Waiting for PT consult    Goal: Discharge plan established Outcome: Progressing Waiting for SW consult

## 2014-08-02 NOTE — Progress Notes (Deleted)
[facility] PROGRESS NOTE  [name] [id number] DOB: [birth year] DOA: 07/31/2014 PCP: [name], [name], MD  Assessment/Plan:  Active Problems:   Hip fracture, s/p IM implant 11/26   Diabetes mellitus type 2, controlled   Essential hypertension controlled   UTI (urinary tract infection): start bactrim x3 days. Could explain leukocytosis   Postoperative anemia, expected. monitor Hearing-impaired: Wears hearing aids.  Code Status:  full Family Communication:  Granddaughter at bedside Disposition Plan:  home  HPI/Subjective: Pain controlled. No dysuria, sp pain, f/c  Objective: Filed Vitals:   08/02/14 0800  BP:   Pulse:   Temp:   Resp: 16    Intake/Output Summary (Last 24 hours) at 08/02/14 0900 Last data filed at 08/02/14 [id number]  Gross per 24 hour  Intake   2025 ml  Output   1000 ml  Net   1025 ml   There were no vitals filed for this visit.  Exam:   General:  Asleep. Arousable. HOH. Oriented and appropriater  Cardiovascular: RRR  Respiratory: CTA  Abdomen: S, NT, ND  Ext: dressing CDI. No edema  Basic Metabolic Panel:  Recent Labs Lab 08/01/14 0034 08/01/14 0531 08/02/14 0403  NA 133* 132* 135*  K 3.8 4.2 3.9  CL 89* 90* 96  CO2 28 27 26   GLUCOSE 211* 215* 139*  BUN 16 17 19   CREATININE 1.13 1.09 1.25  CALCIUM 9.6 9.4 8.2*   Liver Function Tests:  Recent Labs Lab 08/01/14 0034 08/01/14 0531  AST 36 32  ALT 28 27  ALKPHOS 144* 127*  BILITOT 0.4 0.4  PROT 7.6 7.0  ALBUMIN 3.8 3.5   No results for input(s): LIPASE, AMYLASE in the last 168 hours. No results for input(s): AMMONIA in the last 168 hours. CBC:  Recent Labs Lab 08/01/14 0034 08/01/14 0531 08/02/14 0403  WBC 17.2* 17.8* 12.8*  NEUTROABS 15.1* 16.0*  --   HGB 15.3 13.4 9.0*  HCT 44.6 40.4 25.8*  MCV 91.6 92.0 91.2  PLT 205 218 167   Cardiac Enzymes: No results for input(s): CKTOTAL, CKMB, CKMBINDEX, TROPONINI in the last 168 hours. BNP (last 3  results) No results for input(s): PROBNP in the last 8760 hours. CBG:  Recent Labs Lab 08/01/14 1353 08/01/14 1617 08/02/14 0022 08/02/14 0423 08/02/14 0727  GLUCAP 150* 170* 186* 159* 126*    Recent Results (from the past 240 hour(s))  Surgical pcr screen     Status: None   Collection Time: 08/01/14  8:57 AM  Result Value Ref Range Status   MRSA, PCR NEGATIVE NEGATIVE Final   Staphylococcus aureus NEGATIVE NEGATIVE Final    Comment:        The Xpert SA Assay (FDA approved for NASAL specimens in patients over 621 years of age), is one component of a comprehensive surveillance program.  Test performance has been validated by [facility] for patients greater than or equal to 78 year old. It is not intended to diagnose infection nor to guide or monitor treatment.      Studies: Dg Chest 1 View  08/01/2014   CLINICAL DATA:  Hypertension and diabetes.  Tripped and fall.  EXAM: CHEST - 1 VIEW  COMPARISON:  None.  FINDINGS: Normal heart size. Calcified atherosclerotic disease involves the aortic arch. No pleural effusion or edema. Lung volumes are low. No airspace consolidation.  IMPRESSION: 1. No acute findings. 2. Atherosclerotic disease noted.   Electronically Signed   By: Signa Kellaylor  Stroud M.D.   On: 08/01/2014 01:00   Dg  Hip Complete Right  08/01/2014   CLINICAL DATA:  Fall with right hip pain  EXAM: RIGHT HIP - COMPLETE 2+ VIEW  COMPARISON:  None.  FINDINGS: There is a spiral fracture of the proximal right femur, extending from the upper diaphysis into the lesser trochanteric region. The lesser trochanter is displaced proximally. No fracture identified across the intertrochanteric line. Both hips are located. No evidence of pelvic ring fracture. Osteopenia which is profound. Diffuse arterial calcification.  IMPRESSION: Sub- and lesser trochanteric femur fracture on the right.   Electronically Signed   By: Tiburcio PeaJonathan  Watts M.D.   On: 08/01/2014 00:46   Dg Femur  Right  08/01/2014   CLINICAL DATA:  ORIF RIGHT femur  EXAM: DG C-ARM 1-60 MIN - NRPT [facility]; RIGHT FEMUR - 2 VIEW  COMPARISON:  07/31/2014 radiographs  FLUOROSCOPY TIME:  2 min 4 seconds  FINDINGS: Long IM nail with proximal and distal screws identified in RIGHT femur post ORIF of displaced subtrochanteric and lesser trochanteric fractures of the RIGHT femur.  Fracture appears grossly reduced.  Bones demineralized.  No dislocation.  Extensive atherosclerotic calcifications noted.  IMPRESSION: Post ORIF of a proximal RIGHT femoral fracture.   Electronically Signed   By: Ulyses SouthwardMark  Boles M.D.   On: 08/01/2014 15:59   Dg C-arm 1-60 Min-no Report  08/01/2014   CLINICAL DATA:  ORIF RIGHT femur  EXAM: DG C-ARM 1-60 MIN - NRPT [facility]; RIGHT FEMUR - 2 VIEW  COMPARISON:  07/31/2014 radiographs  FLUOROSCOPY TIME:  2 min 4 seconds  FINDINGS: Long IM nail with proximal and distal screws identified in RIGHT femur post ORIF of displaced subtrochanteric and lesser trochanteric fractures of the RIGHT femur.  Fracture appears grossly reduced.  Bones demineralized.  No dislocation.  Extensive atherosclerotic calcifications noted.  IMPRESSION: Post ORIF of a proximal RIGHT femoral fracture.   Electronically Signed   By: Ulyses SouthwardMark  Boles M.D.   On: 08/01/2014 15:59    Scheduled Meds: .  ceFAZolin (ANCEF) IV  2 g Intravenous Q6H  . enoxaparin (LOVENOX) injection  40 mg Subcutaneous Q24H  . insulin aspart  0-9 Units Subcutaneous 6 times per day  . metoprolol  2.5 mg Intravenous 3 times per day  . senna  1 tablet Oral BID   Continuous Infusions: . sodium chloride 125 mL/hr at 08/01/14 2315  . lactated ringers    . lactated ringers 10 mL/hr at 08/01/14 1257    Time spent: 35 minutes  Tiwan Schnitker L  [facility] Text page [email or web address], password [email or web address] 08/02/2014, 9:00 AM  LOS: 2 days

## 2014-08-02 NOTE — Progress Notes (Signed)
Patient ID: [name], male   DOB: Nov 29, 1923, 80 y.o.   MRN: [id number] Subjective: 1 Day Post-Op Procedure(s) (LRB): INTRAMEDULLARY (IM) NAIL FEMORAL (Right) Awake,alert and oriented x 4. Mild discomfort. Patient reports pain as mild.    Objective:   VITALS:  Temp:  [97.7 F (36.5 C)-99.7 F (37.6 C)] 99.7 F (37.6 C) (11/26 0623) Pulse Rate:  [63-80] 80 (11/26 0623) Resp:  [12-18] 16 (11/26 0800) BP: (103-181)/(45-74) 103/50 mmHg (11/26 0623) SpO2:  [95 %-100 %] 97 % (11/26 0623) Weight:  [54.432 kg (120 lb)] 54.432 kg (120 lb) (11/26 1000)  Neurologically intact ABD soft Neurovascular intact Sensation intact distally Intact pulses distally Dorsiflexion/Plantar flexion intact Incision: scant drainage Mild swelling right thigh.   LABS  Recent Labs  08/01/14 0034 08/01/14 0531 08/02/14 0403  HGB 15.3 13.4 9.0*  WBC 17.2* 17.8* 12.8*  PLT 205 218 167    Recent Labs  08/01/14 0531 08/02/14 0403  NA 132* 135*  K 4.2 3.9  CL 90* 96  CO2 27 26  BUN 17 19  CREATININE 1.09 1.25  GLUCOSE 215* 139*    Recent Labs  08/01/14 0531  INR 1.03     Assessment/Plan: 1 Day Post-Op Procedure(s) (LRB): INTRAMEDULLARY (IM) NAIL FEMORAL (Right)  Advance diet Up with therapy  [name] 08/02/2014, 12:42 PM

## 2014-08-02 NOTE — Progress Notes (Signed)
[name] [name], MD Physician Signed Internal Medicine Progress Notes 08/02/2014 8:59 AM    Expand All Collapse All    [facility] PROGRESS NOTE  [name] OZH:[id number] DOB: [birth year] DOA: 07/31/2014 PCP: [name], [name], MD  Assessment/Plan:  Active Problems:  Hip fracture, s/p IM implant 11/26  Diabetes mellitus type 2, controlled  Essential hypertension controlled  UTI (urinary tract infection): start bactrim x3 days. Could explain leukocytosis  Postoperative anemia, expected. monitor  Code Status: full Family Communication:  Disposition Plan: SNF  HPI/Subjective: Pain controlled. No dysuria, sp pain, f/c  Objective: Filed Vitals:   08/02/14 0800  BP:   Pulse:   Temp:   Resp: 16    Intake/Output Summary (Last 24 hours) at 08/02/14 0900 Last data filed at 08/02/14 [id number]  Gross per 24 hour  Intake  2025 ml  Output  1000 ml  Net  1025 ml   There were no vitals filed for this visit.  Exam:   General:  A and o, appropriate  Cardiovascular: RRR without MGR  Respiratory: CTA without WRR  Abdomen: S, NT, ND  Ext: dressing CDI. No edema  Basic Metabolic Panel:  Last Labs      Recent Labs Lab 08/01/14 0034 08/01/14 0531 08/02/14 0403  NA 133* 132* 135*  K 3.8 4.2 3.9  CL 89* 90* 96  CO2 28 27 26   GLUCOSE 211* 215* 139*  BUN 16 17 19   CREATININE 1.13 1.09 1.25  CALCIUM 9.6 9.4 8.2*     Liver Function Tests:  Last Labs      Recent Labs Lab 08/01/14 0034 08/01/14 0531  AST 36 32  ALT 28 27  ALKPHOS 144* 127*  BILITOT 0.4 0.4  PROT 7.6 7.0  ALBUMIN 3.8 3.5      Last Labs     No results for input(s): LIPASE, AMYLASE in the last 168 hours.    Last Labs     No results for input(s): AMMONIA in the last 168 hours.   CBC:  Last Labs      Recent Labs Lab 08/01/14 0034 08/01/14 0531 08/02/14 0403  WBC 17.2* 17.8* 12.8*   NEUTROABS 15.1* 16.0* --   HGB 15.3 13.4 9.0*  HCT 44.6 40.4 25.8*  MCV 91.6 92.0 91.2  PLT 205 218 167     Cardiac Enzymes:  Last Labs     No results for input(s): CKTOTAL, CKMB, CKMBINDEX, TROPONINI in the last 168 hours.   BNP (last 3 results)  Recent Labs (within last 365 days)    No results for input(s): PROBNP in the last 8760 hours.   CBG:  Last Labs      Recent Labs Lab 08/01/14 1353 08/01/14 1617 08/02/14 0022 08/02/14 0423 08/02/14 0727  GLUCAP 150* 170* 186* 159* 126*      Recent Results (from the past 240 hour(s))  Surgical pcr screen Status: None   Collection Time: 08/01/14 8:57 AM  Result Value Ref Range Status   MRSA, PCR NEGATIVE NEGATIVE Final   Staphylococcus aureus NEGATIVE NEGATIVE Final    Comment:   The Xpert SA Assay (FDA approved for NASAL specimens in patients over 78 years of age), is one component of a comprehensive surveillance program. Test performance has been validated by [facility] for patients greater than or equal to 78 year old. It is not intended to diagnose infection nor to guide or monitor treatment.      Studies:  Imaging Results (Last 48 hours)    Dg  Chest 1 View  08/01/2014 CLINICAL DATA: Hypertension and diabetes. Tripped and fall. EXAM: CHEST - 1 VIEW COMPARISON: None. FINDINGS: Normal heart size. Calcified atherosclerotic disease involves the aortic arch. No pleural effusion or edema. Lung volumes are low. No airspace consolidation. IMPRESSION: 1. No acute findings. 2. Atherosclerotic disease noted. Electronically Signed By: Signa Kellaylor Stroud M.D. On: 08/01/2014 01:00   Dg Hip Complete Right  08/01/2014 CLINICAL DATA: Fall with right hip pain EXAM: RIGHT HIP - COMPLETE 2+ VIEW COMPARISON: None. FINDINGS: There is a spiral fracture of the proximal right femur, extending from the upper diaphysis into the lesser trochanteric  region. The lesser trochanter is displaced proximally. No fracture identified across the intertrochanteric line. Both hips are located. No evidence of pelvic ring fracture. Osteopenia which is profound. Diffuse arterial calcification. IMPRESSION: Sub- and lesser trochanteric femur fracture on the right. Electronically Signed By: Tiburcio PeaJonathan Watts M.D. On: 08/01/2014 00:46   Dg Femur Right  08/01/2014 CLINICAL DATA: ORIF RIGHT femur EXAM: DG C-ARM 1-60 MIN - NRPT [facility]; RIGHT FEMUR - 2 VIEW COMPARISON: 07/31/2014 radiographs FLUOROSCOPY TIME: 2 min 4 seconds FINDINGS: Long IM nail with proximal and distal screws identified in RIGHT femur post ORIF of displaced subtrochanteric and lesser trochanteric fractures of the RIGHT femur. Fracture appears grossly reduced. Bones demineralized. No dislocation. Extensive atherosclerotic calcifications noted. IMPRESSION: Post ORIF of a proximal RIGHT femoral fracture. Electronically Signed By: Ulyses SouthwardMark Boles M.D. On: 08/01/2014 15:59   Dg C-arm 1-60 Min-no Report  08/01/2014 CLINICAL DATA: ORIF RIGHT femur EXAM: DG C-ARM 1-60 MIN - NRPT [facility]; RIGHT FEMUR - 2 VIEW COMPARISON: 07/31/2014 radiographs FLUOROSCOPY TIME: 2 min 4 seconds FINDINGS: Long IM nail with proximal and distal screws identified in RIGHT femur post ORIF of displaced subtrochanteric and lesser trochanteric fractures of the RIGHT femur. Fracture appears grossly reduced. Bones demineralized. No dislocation. Extensive atherosclerotic calcifications noted. IMPRESSION: Post ORIF of a proximal RIGHT femoral fracture. Electronically Signed By: Ulyses SouthwardMark Boles M.D. On: 08/01/2014 15:59     Scheduled Meds: . ceFAZolin (ANCEF) IV 2 g Intravenous Q6H  . enoxaparin (LOVENOX) injection 40 mg Subcutaneous Q24H  . insulin aspart 0-9 Units Subcutaneous 6 times per day  . metoprolol 2.5 mg Intravenous 3 times per day  . senna 1 tablet Oral BID    Continuous Infusions: . sodium chloride 125 mL/hr at 08/01/14 2315  . lactated ringers   . lactated ringers 10 mL/hr at 08/01/14 1257    Time spent: 35 minutes  [name] Mathis [facility] Text page [email or web address], password [email or web address] 08/02/2014, 9:00 AM  LOS: 2 days

## 2014-08-03 LAB — BASIC METABOLIC PANEL
Anion gap: 10 (ref 5–15)
BUN: 15 mg/dL (ref 6–23)
CALCIUM: 8.6 mg/dL (ref 8.4–10.5)
CHLORIDE: 91 meq/L — AB (ref 96–112)
CO2: 30 meq/L (ref 19–32)
CREATININE: 1.29 mg/dL (ref 0.50–1.35)
GFR calc non Af Amer: 48 mL/min — ABNORMAL LOW (ref >90)
GFR, EST AFRICAN AMERICAN: 55 mL/min — AB (ref >90)
Glucose, Bld: 128 mg/dL — ABNORMAL HIGH (ref 70–99)
Potassium: 3.4 mEq/L — ABNORMAL LOW (ref 3.7–5.3)
SODIUM: 131 meq/L — AB (ref 137–147)

## 2014-08-03 LAB — CBC
HCT: 20.6 % — ABNORMAL LOW (ref 39.0–52.0)
Hemoglobin: 7.4 g/dL — ABNORMAL LOW (ref 13.0–17.0)
MCH: 32.3 pg (ref 26.0–34.0)
MCHC: 35.9 g/dL (ref 30.0–36.0)
MCV: 90 fL (ref 78.0–100.0)
Platelets: 135 10*3/uL — ABNORMAL LOW (ref 150–400)
RBC: 2.29 MIL/uL — AB (ref 4.22–5.81)
RDW: 12.7 % (ref 11.5–15.5)
WBC: 10.8 10*3/uL — AB (ref 4.0–10.5)

## 2014-08-03 LAB — GLUCOSE, CAPILLARY
GLUCOSE-CAPILLARY: 201 mg/dL — AB (ref 70–99)
Glucose-Capillary: 137 mg/dL — ABNORMAL HIGH (ref 70–99)
Glucose-Capillary: 120 mg/dL — ABNORMAL HIGH (ref 70–99)
Glucose-Capillary: 145 mg/dL — ABNORMAL HIGH (ref 70–99)

## 2014-08-03 MED ORDER — POTASSIUM CHLORIDE CRYS ER 20 MEQ PO TBCR
40.0000 meq | EXTENDED_RELEASE_TABLET | Freq: Once | ORAL | Status: AC
  Administered 2014-08-03: 40 meq via ORAL
  Filled 2014-08-03: qty 2

## 2014-08-03 NOTE — Clinical Social Work Placement (Addendum)
Clinical Social Work Department CLINICAL SOCIAL WORK PLACEMENT NOTE 08/03/2014  Patient:  [name],[name]  Account Number:  [id number] Admit date:  07/31/2014  Clinical Social Worker:  Read DriversEGINA INGLE, LCSWA  Date/time:  08/03/2014 03:20 PM  Clinical Social Work is seeking post-discharge placement for this patient at the following level of care:   SKILLED NURSING   (*CSW will update this form in Epic as items are completed)   08/03/2014  Patient/family provided with [facility] Department of Clinical Social Work's list of facilities offering this level of care within the geographic area requested by the patient (or if unable, by the patient's family).  08/03/2014  Patient/family informed of their freedom to choose among providers that offer the needed level of care, that participate in Medicare, Medicaid or managed care program needed by the patient, have an available bed and are willing to accept the patient.  08/03/2014  Patient/family informed of [facility]' ownership interest in [facility], as well as of the fact that they are under no obligation to receive care at this facility.  PASARR submitted to EDS on 08/03/2014 PASARR number received on 08/03/2014  FL2 transmitted to all facilities in geographic area requested by pt/family on  08/03/2014 FL2 transmitted to all facilities within larger geographic area on   Patient informed that his/her managed care company has contracts with or will negotiate with  certain facilities, including the following:     Patient/family informed of bed offers received:  Bed choice ([facility]) from weekend handoff. [facility] Marcelline Deist(Betania Dizon, [place]) Physician recommends and patient chooses bed at    [facility] on  08/06/2014 Marcelline Deist(Noorah Giammona, [place]) Patient to be transferred to facility by [facility] Patient and family notified of transfer on 08/06/2014 Marcelline Deist(Linsey Hirota, Theresia MajorsLCSWA) Name of family member  notified:  Pt and pt's son, Georgeanna HarrisonSolomon Bialas JR, updated regarding discharge. Lily Kocher(Rubi Tooley, LCSWA)  The following physician request were entered in Epic:   Additional Comments:  Vickii PennaGina Ingle, LCSWA [phone]  Psychiatric & Orthopedics (5N 1-16) Clinical Social Worker   Marcelline Deistmily Donyea Gafford, [place] [phone]) Licensed Clinical Social Worker Orthopedics [phone]([id number]) and Surgical [phone]([id number])

## 2014-08-03 NOTE — Evaluation (Signed)
Occupational Therapy Evaluation and Discharge Patient Details Name: [name] MRN: [id number] DOB: 05/10/1924 Today's Date: 08/03/2014    History of Present Illness 80 y.o. [facility] s/p fall with resultant left femur fx.  Pt is now s/p IM Nail and has been made PWB 50% on his right leg.  Pt with significant PMHx of DM and HTN.     Clinical Impression   This 80 yo male admitted and underwent above presents to acute OT with decreased balance, decreased mobility, increased pain, 50% WB'ing on LLE all affecting his ability to care for himself at an independent level as he was pta (he even walked to the grocery store since he does not drive). He will benefit from continued therapy at SNF. Acute OT will sign off.    Follow Up Recommendations  SNF    Equipment Recommendations   (TBD at next venue)       Precautions / Restrictions Precautions Precautions: Fall Restrictions RLE Weight Bearing: Partial weight bearing RLE Partial Weight Bearing Percentage or Pounds: 50%      Mobility Bed Mobility Overal bed mobility: Needs Assistance Bed Mobility: Supine to Sit     Supine to sit: Min assist     General bed mobility comments: Min assist to help progress right leg over EOB.  Verbal cues for hand palcement and sequencing.   Transfers Overall transfer level: Needs assistance Equipment used: Rolling walker (2 wheeled) Transfers: Sit to/from Stand Sit to Stand: +2 safety/equipment;Mod assist         General transfer comment: Two person mod assist to support trunk during transitions.  Verbal cues for safe hand placement.     Balance Overall balance assessment: Needs assistance Sitting-balance support: Feet supported;No upper extremity supported;Bilateral upper extremity supported Sitting balance-Leahy Scale: Good     Standing balance support: Bilateral upper extremity supported Standing balance-Leahy Scale: Poor                             ADL Overall ADL's : Needs assistance/impaired Eating/Feeding: Set up;Sitting   Grooming: Set up;Sitting   Upper Body Bathing: Set up;Sitting   Lower Body Bathing: Maximal assistance (with +2 mod A sit<>stand)   Upper Body Dressing : Minimal assistance;Sitting   Lower Body Dressing: Total assistance (with +2 mod A sit<>stand)   Toilet Transfer: Moderate assistance;+2 for physical assistance;Ambulation;RW (bed>8 fee then sit in recliner behind him)   Toileting- ArchitectClothing Manipulation and Hygiene: Total assistance (with +2 mod A sit<>stand)                         Pertinent Vitals/Pain Pain Assessment: 0-10 Pain Score: 7  Pain Location: when I move, right hip Pain Descriptors / Indicators: Aching;Burning Pain Intervention(s): Limited activity within patient's tolerance;Monitored during session;Repositioned        Extremity/Trunk Assessment Upper Extremity Assessment Upper Extremity Assessment: Overall WFL for tasks assessed   Lower Extremity Assessment Lower Extremity Assessment: RLE deficits/detail RLE Deficits / Details: right leg with normal post op pain and weakness.  Pt with 3/5 ankle, 2/5 knee extension, 2+/5 hip flexion/adduction   Cervical / Trunk Assessment Cervical / Trunk Assessment: Normal   Communication Communication Communication: No difficulties   Cognition Arousal/Alertness: Awake/alert Behavior During Therapy: WFL for tasks assessed/performed Overall Cognitive Status: Within Functional Limits for tasks assessed  Home Living Family/patient expects to be discharged to:: Skilled nursing facility Living Arrangements: Alone Available Help at Discharge: Other (Comment) (family is all out of town, has neighbors, but no one 24/7) Type of Home: House Home Access: Stairs to enter [employer]trance Stairs-Number of Steps: 3,7 (three in back seven in the front) Entrance Stairs-Rails: Right;Left;None;Can reach both  (none on the back, front he can reach both) Home Layout: One level               Home Equipment: None          Prior Functioning/Environment Level of Independence: Independent        Comments: Never did drive, walks to get his groceries    OT Diagnosis: Generalized weakness;Acute pain   OT Problem List: Decreased strength;Decreased range of motion;Decreased activity tolerance;Impaired balance (sitting and/or standing);Pain;Decreased knowledge of precautions;Decreased knowledge of use of DME or AE      OT Goals(Current goals can be found in the care plan section) Acute Rehab OT Goals Patient Stated Goal: to get "professional care" before going back home by himself.   OT Frequency:             Co-evaluation PT/OT/SLP Co-Evaluation/Treatment: Yes Reason for Co-Treatment: For patient/therapist safety   OT goals addressed during session: ADL's and self-care      End of Session Equipment Utilized During Treatment: Gait belt;Rolling walker Nurse Communication: Mobility status (NT)  Activity Tolerance: Patient limited by fatigue (nausea) Patient left: in chair;with call bell/phone within reach;with chair alarm set   Time: [id number] OT Time Calculation (min): 30 min Charges:  OT General Charges $OT Visit: 1 Procedure OT Evaluation $Initial OT Evaluation Tier I: 1 Procedure OT Treatments $Self Care/Home Management : 8-22 mins  Evette GeorgesLeonard, Eline Geng Eva [phone] 08/03/2014, 12:27 PM

## 2014-08-03 NOTE — Plan of Care (Signed)
Problem: Phase II Progression Outcomes Goal: Bed to chair Outcome: Completed/Met Date Met:  08/03/14 Goal: Discharge plan established Outcome: Completed/Met Date Met:  08/03/14  Problem: Phase III Progression Outcomes Goal: Pain controlled on oral analgesia Outcome: Completed/Met Date Met:  08/03/14 Goal: Incision clean - minimal/no drainage Outcome: Completed/Met Date Met:  08/03/14

## 2014-08-03 NOTE — Clinical Social Work Psychosocial (Signed)
Clinical Social Work Department BRIEF PSYCHOSOCIAL ASSESSMENT 08/03/2014  Patient:  Jesse AndersonLMER,Rocklin     Account Number:  [id number]     Admit date:  07/31/2014  Clinical Social Worker:  Read DriversINGLE,Gari Trovato, LCSWA  Date/Time:  08/03/2014 03:14 PM  Referred by:  Physician  Date Referred:  08/03/2014 Referred for  SNF Placement   Other Referral:   none   Interview type:  Patient Other interview type:   none    PSYCHOSOCIAL DATA Living Status:  ALONE Admitted from facility:   Level of care:   Primary support name:  Rod MaeSolomon Jr Primary support relationship to patient:  CHILD, ADULT Degree of support available:   padequate    CURRENT CONCERNS Current Concerns  Post-Acute Placement   Other Concerns:   none    SOCIAL WORK ASSESSMENT / PLAN CSW assessed patient at bedside. Patient is alert and oriented x4.  Patient reports being from home alone prior to hospitalization. Patient remained pleasant and optimisic regarding his recovery and progression. Patient is aware of PT's recommendation of SNF/STR once medically discharged and is agreeable to this disposition. CSW received verbal permission [place] [facility] CSW will present bed offers once available.  Patient is projected to discharge over the weekend.  Patient will need PTAR transportation due to limited support   Assessment/plan status:  Psychosocial Support/Ongoing Assessment of Needs Other assessment/ plan:   FL2  PASARR   Information/referral to community resources:   SNF/STR    PATIENT'S/FAMILY'S RESPONSE TO PLAN OF CARE: Patient [place] agree[facility] [place] [place] [facility].       Vickii PennaGina Amariz Flamenco, LCSWA [phone]  Psychiatric & Orthopedics (5N 1-16) Clinical Social Worker

## 2014-08-03 NOTE — Evaluation (Signed)
Physical Therapy Evaluation Patient Details Name: Jesse AndersonSolomon Mathis MRN: [id number] DOB: 08/19/1924 Today's Date: 08/03/2014   History of Present Illness  80 [facility].o. [facility] s/p fall with resultant left femur fx.  Pt is now s/p IM Nail and has been made PWB 50% on his right leg.  Pt with significant PMHx of DM and HTN.    Clinical Impression  Pt was able to get up OOB and take a couple of steps with RW today.  We educated him on PWB 50% status and started some of his hip/leg exercises.  He lives alone and has limited support at discharge.  He is most appropriate for SNF level rehab before returning to independent living.   PT to follow acutely for deficits listed below.       Follow Up Recommendations SNF    Equipment Recommendations  Rolling walker with 5" wheels    Recommendations for Other Services   NA    Precautions / Restrictions Precautions Precautions: Fall Restrictions RLE Weight Bearing: Partial weight bearing RLE Partial Weight Bearing Percentage or Pounds: 50%      Mobility  Bed Mobility Overal bed mobility: Needs Assistance Bed Mobility: Supine to Sit     Supine to sit: Min assist     General bed mobility comments: Min assist to help progress right leg over EOB.  Verbal cues for hand palcement and sequencing.   Transfers Overall transfer level: Needs assistance Equipment used: Rolling walker (2 wheeled) Transfers: Sit to/from Stand Sit to Stand: +2 safety/equipment;Mod assist         General transfer comment: Two person mod assist to support trunk during transitions.  Verbal cues for safe hand placement.   Ambulation/Gait Ambulation/Gait assistance: +2 safety/equipment;Min assist Ambulation Distance (Feet): 8 Feet Assistive device: Rolling walker (2 wheeled) Gait Pattern/deviations: Step-to pattern;Antalgic;Trunk flexed Gait velocity: decreased Gait velocity interpretation: Below normal speed for age/gender General Gait Details:  Pt needed min assist to support his trunk and help steer RW during gait.  Second person for safety following with chair and assisting as well.  Verbal cues for safe LE sequencing and upright posture (pt liked to look at his feet during gait).          Balance Overall balance assessment: Needs assistance Sitting-balance support: Feet supported;No upper extremity supported;Bilateral upper extremity supported Sitting balance-Leahy Scale: Good     Standing balance support: Bilateral upper extremity supported Standing balance-Leahy Scale: Poor                               Pertinent Vitals/Pain Pain Assessment: 0-10 Pain Score: 7  Pain Location: when I move, right hip Pain Descriptors / Indicators: Aching;Burning Pain Intervention(s): Limited activity within patient's tolerance;Monitored during session;Repositioned    Home Living Family/patient expects to be discharged to:: Private residence Living Arrangements: Alone Available Help at Discharge: Other (Comment) (family is all out of town, has neighbors, but no one 24/7) Type of Home: House Home Access: Stairs to enter Entrance Stairs-Rails: Right;Left;None;Can reach both (none on the back, front he can reach both) Entrance Stairs-Number of Steps: 3,7 (three in back seven in the front) Home Layout: One level Home Equipment: None      Prior Function Level of Independence: Independent         Comments: Never did drive, walks to get his groceries        Extremity/Trunk Assessment   Upper Extremity Assessment: Defer  to OT evaluation           Lower Extremity Assessment: RLE deficits/detail RLE Deficits / Details: right leg with normal post op pain and weakness.  Pt with 3/5 ankle, 2/5 knee extension, 2+/5 hip flexion/adduction    Cervical / Trunk Assessment: Normal  Communication   Communication: No difficulties  Cognition Arousal/Alertness: Awake/alert Behavior During Therapy: WFL for tasks  assessed/performed Overall Cognitive Status: Within Functional Limits for tasks assessed                      General Comments General comments (skin integrity, edema, etc.): Pt nauseated at the end of gait.  Ice applied to right hip for comfort.  Verbally reviewed PWB 50% precaution with pt before getttin up OOB.     Exercises Total Joint Exercises Ankle Circles/Pumps: AROM;10 reps;Both;Supine Quad Sets: AROM;Right;5 reps;Supine Heel Slides: AAROM;Right;5 reps;Supine      Assessment/Plan    PT Assessment Patient needs continued PT services  PT Diagnosis Difficulty walking;Abnormality of gait;Generalized weakness;Acute pain   PT Problem List Decreased strength;Decreased range of motion;Decreased activity tolerance;Decreased balance;Decreased mobility;Decreased knowledge of use of DME;Decreased knowledge of precautions;Pain  PT Treatment Interventions DME instruction;Gait training;Stair training;Functional mobility training;Therapeutic activities;Therapeutic exercise;Balance training;Neuromuscular re-education;Patient/family education;Modalities;Manual techniques   PT Goals (Current goals can be found in the Care Plan section) Acute Rehab PT Goals Patient Stated Goal: to get "professional care" before going back home by himself.  PT Goal Formulation: With patient Time For Goal Achievement: 08/10/14 Potential to Achieve Goals: Good    Frequency Min 3X/week   Barriers to discharge Decreased caregiver support Pt doesn't have family nearby and really doesn't have consistant help that he could get at discharge.  He normally walks > 5 blocks to get his groceries at the store down the street.        End of Session Equipment Utilized During Treatment: Gait belt Activity Tolerance: Patient limited by pain;Other (comment) (limited by nausea) Patient left: in chair;with call bell/phone within reach Nurse Communication: Mobility status (to RN tech)         Time: [id number] PT  Time Calculation (min) (ACUTE ONLY): 32 min   Charges:   PT Evaluation $Initial PT Evaluation Tier I: 1 Procedure PT Treatments $Therapeutic Activity: 8-22 mins        Myrene Bougher B. Rissie Sculley, PT, DPT [phone]   08/03/2014, 11:41 AM

## 2014-08-03 NOTE — Clinical Social Work Note (Addendum)
Patient has limited support and will need SNF.  [place] [facility] [place] [facility] Possible weekend dc per MD.  Vickii PennaGina Brinson Tozzi, LCSWA [phone]  Psychiatric & Orthopedics (5N 1-16) Clinical Social Worker

## 2014-08-03 NOTE — Progress Notes (Signed)
Patient ID: Jesse AndersonSolomon Quesnel, male   DOB: Feb 02, 1924, 80 y.o.   MRN: [id number] Subjective: 2 Days Post-Op Procedure(s) (LRB): INTRAMEDULLARY (IM) NAIL FEMORAL (Right) Awake, alert and oriented x 4. Narcotics are holding the discomfort. Patient reports pain as moderate.    Objective:   VITALS:  Temp:  [98.5 F (36.9 C)-99.7 F (37.6 C)] 99.7 F (37.6 C) (11/27 0527) Pulse Rate:  [74-93] 89 (11/27 0527) Resp:  [16-18] 16 (11/27 0527) BP: (104-112)/(38-50) 104/38 mmHg (11/27 0527) SpO2:  [95 %-99 %] 95 % (11/27 0527)  Neurologically intact ABD soft Neurovascular intact Sensation intact distally Intact pulses distally Dorsiflexion/Plantar flexion intact Incision: dressing C/D/I and no drainage Swelling left thigh decreased.  LABS  Recent Labs  08/01/14 0034 08/01/14 0531 08/02/14 0403 08/03/14 0347  HGB 15.3 13.4 9.0* 7.4*  WBC 17.2* 17.8* 12.8* 10.8*  PLT 205 218 167 135*    Recent Labs  08/02/14 0403 08/03/14 0347  NA 135* 131*  K 3.9 3.4*  CL 96 91*  CO2 26 30  BUN 19 15  CREATININE 1.25 1.29  GLUCOSE 139* 128*    Recent Labs  08/01/14 0531  INR 1.03     Assessment/Plan: 2 Days Post-Op Procedure(s) (LRB): INTRAMEDULLARY (IM) NAIL FEMORAL (Right) Perioperative acute blood loss anemia.  Advance diet Up with therapy Discharge to SNF    Carlester Kasparek E 08/03/2014, 10:57 AM

## 2014-08-03 NOTE — Progress Notes (Signed)
Christiane Haorinna L Srinivas Lippman, MD Physician Signed Internal Medicine Progress Notes 08/02/2014 8:59 AM    Expand All Collapse All    [facility] PROGRESS NOTE  Cherly AndersonSolomon Troop ZOX:[id number] DOB: 03-13-1924 DOA: 07/31/2014 PCP: [name], [name], MD  Assessment/Plan:  Active Problems:  Hip fracture, s/p IM implant 11/26  Diabetes mellitus type 2, controlled  Essential hypertension controlled  UTI (urinary tract infection): start bactrim x3 days. Culture with multiple bacterial morphotypes  Postoperative anemia, transfuse if drops lower Hypokalemia: replete po  Code Status: full Family Communication:  Disposition Plan: SNF  HPI/Subjective: Some pain. No other complaints  Objective: Filed Vitals:   08/02/14 0800  BP:   Pulse:   Temp:   Resp: 16    Intake/Output Summary (Last 24 hours) at 08/02/14 0900 Last data filed at 08/02/14 [id number]  Gross per 24 hour  Intake  2025 ml  Output  1000 ml  Net  1025 ml   There were no vitals filed for this visit.  Exam:   General:  A and o, appropriate in chair talking on phone  Cardiovascular: RRR without MGR  Respiratory: CTA without WRR  Abdomen: S, NT, ND  Ext: dressing CDI. No edema  Basic Metabolic Panel:        BMET    Component Value Date/Time   NA 131* 08/03/2014 0347   K 3.4* 08/03/2014 0347   CL 91* 08/03/2014 0347   CO2 30 08/03/2014 0347   GLUCOSE 128* 08/03/2014 0347   BUN 15 08/03/2014 0347   CREATININE 1.29 08/03/2014 0347   CALCIUM 8.6 08/03/2014 0347   GFRNONAA 48* 08/03/2014 0347   GFRAA 55* 08/03/2014 0347    Liver Function Tests:  Last Labs      Recent Labs Lab 08/01/14 0034 08/01/14 0531  AST 36 32  ALT 28 27  ALKPHOS 144* 127*  BILITOT 0.4 0.4  PROT 7.6 7.0  ALBUMIN 3.8 3.5      Last Labs     No results for input(s): LIPASE, AMYLASE in the last 168 hours.    Last Labs     No results for input(s): AMMONIA in the last 168  hours.   CBC:  Last Labs     CBC Latest Ref Rng 08/03/2014 08/02/2014 08/01/2014  WBC 4.0 - 10.5 K/uL 10.8(H) 12.8(H) 17.8(H)  Hemoglobin 13.0 - 17.0 g/dL 7.4(L) 9.0(L) 13.4  Hematocrit 39.0 - 52.0 % 20.6(L) 25.8(L) 40.4  Platelets 150 - 400 K/uL 135(L) 167 218    Recent Labs                                       Cardiac Enzymes:  Last Labs     No results for input(s): CKTOTAL, CKMB, CKMBINDEX, TROPONINI in the last 168 hours.   BNP (last 3 results)  Recent Labs (within last 365 days)    No results for input(s): PROBNP in the last 8760 hours.   CBG:  Last Labs      Recent Labs Lab 08/01/14 1353 08/01/14 1617 08/02/14 0022 08/02/14 0423 08/02/14 0727  GLUCAP 150* 170* 186* 159* 126*      Norah Devin L [facility] Text page [email or web address], password [email or web address] 08/02/2014, 9:00 AM  LOS: 2 days

## 2014-08-03 NOTE — Care Management Note (Signed)
Utilization review completed. Aymar Whitfill, RN BSN Case Manager 

## 2014-08-03 NOTE — Care Management Note (Signed)
Second IM given. 

## 2014-08-04 DIAGNOSIS — E43 Unspecified severe protein-calorie malnutrition: Secondary | ICD-10-CM

## 2014-08-04 LAB — PREPARE RBC (CROSSMATCH)

## 2014-08-04 LAB — CBC
HEMATOCRIT: 19.5 % — AB (ref 39.0–52.0)
Hemoglobin: 6.9 g/dL — CL (ref 13.0–17.0)
MCH: 30.9 pg (ref 26.0–34.0)
MCHC: 35.4 g/dL (ref 30.0–36.0)
MCV: 87.4 fL (ref 78.0–100.0)
PLATELETS: 154 10*3/uL (ref 150–400)
RBC: 2.23 MIL/uL — AB (ref 4.22–5.81)
RDW: 12.6 % (ref 11.5–15.5)
WBC: 13.2 10*3/uL — AB (ref 4.0–10.5)

## 2014-08-04 LAB — GLUCOSE, CAPILLARY
GLUCOSE-CAPILLARY: 132 mg/dL — AB (ref 70–99)
Glucose-Capillary: 131 mg/dL — ABNORMAL HIGH (ref 70–99)
Glucose-Capillary: 123 mg/dL — ABNORMAL HIGH (ref 70–99)
Glucose-Capillary: 113 mg/dL — ABNORMAL HIGH (ref 70–99)

## 2014-08-04 MED ORDER — SODIUM CHLORIDE 0.9 % IV SOLN: Freq: Once | INTRAVENOUS | Status: DC

## 2014-08-04 MED ORDER — GLUCERNA SHAKE PO LIQD
237.0000 mL | Freq: Three times a day (TID) | ORAL | Status: DC
  Administered 2014-08-04 – 2014-08-05 (×4): 237 mL via ORAL

## 2014-08-04 NOTE — Progress Notes (Signed)
Jesse Haorinna Mathis Jesse Wee, MD Physician Signed Internal Medicine Progress Notes 08/02/2014 8:59 AM    Expand All Collapse All    [facility] PROGRESS NOTE  Jesse AndersonSolomon Mathis ZOX:[id number] DOB: Apr 30, 1924 DOA: 07/31/2014 PCP: [name], [name], MD  Assessment/Plan:  Active Problems:  Hip fracture, s/p IM implant 11/26  Diabetes mellitus type 2, controlled  Essential hypertension controlled  UTI (urinary tract infection): bactrim x3 days. Culture with multiple bacterial morphotypes  Postoperative anemia, hgb 6.9. Will transfuse 1 unit pRBC Hypokalemia: replete po  Code Status: full Family Communication:  Disposition Plan: SNF  HPI/Subjective: Some pain. No other complaints  Objective: Filed Vitals:   Today's Vitals   08/04/14 0700 08/04/14 0800 08/04/14 1035 08/04/14 1108  BP:   110/54 104/38  Pulse:   84 77  Temp:   98.8 F (37.1 C) 99.2 F (37.3 C)  TempSrc:   Oral Oral  Resp:   16 14  Height:      Weight:      SpO2:   98% 100%  PainSc: 0-No pain 0-No pain      Intake/Output Summary (Last 24 hours) at 08/02/14 0900 Last data filed at 08/02/14 0657  Gross per 24 hour  Intake  2025 ml  Output  1000 ml  Net  1025 ml    Exam:   General:  Asleep arousable. Getting blood transfusion  Cardiovascular: RRR without MGR  Respiratory: CTA without WRR  Abdomen: S, NT, ND  Ext: dressing CDI. No edema  Basic Metabolic Panel:        BMET    Component Value Date/Time   NA 131* 08/03/2014 0347   K 3.4* 08/03/2014 0347   CL 91* 08/03/2014 0347   CO2 30 08/03/2014 0347   GLUCOSE 128* 08/03/2014 0347   BUN 15 08/03/2014 0347   CREATININE 1.29 08/03/2014 0347   CALCIUM 8.6 08/03/2014 0347   GFRNONAA 48* 08/03/2014 0347   GFRAA 55* 08/03/2014 0347    Liver Function Tests:  Last Labs      Recent Labs Lab 08/01/14 0034 08/01/14 0531  AST 36 32  ALT 28 27  ALKPHOS 144* 127*  BILITOT 0.4 0.4  PROT 7.6 7.0   ALBUMIN 3.8 3.5      Last Labs     No results for input(s): LIPASE, AMYLASE in the last 168 hours.    Last Labs     No results for input(s): AMMONIA in the last 168 hours.   CBC:  Last Labs     CBC Latest Ref Rng 08/04/2014 08/03/2014 08/02/2014  WBC 4.0 - 10.5 K/uL 13.2(H) 10.8(H) 12.8(H)  Hemoglobin 13.0 - 17.0 g/dL 6.9(LL) 7.4(Mathis) 9.0(Mathis)  Hematocrit 39.0 - 52.0 % 19.5(Mathis) 20.6(Mathis) 25.8(Mathis)  Platelets 150 - 400 K/uL 154 135(Mathis) 167    Recent Labs                                       Cardiac Enzymes:  Last Labs     No results for input(s): CKTOTAL, CKMB, CKMBINDEX, TROPONINI in the last 168 hours.   BNP (last 3 results)  Recent Labs (within last 365 days)    No results for input(s): PROBNP in the last 8760 hours.   CBG:  Last Labs      Recent Labs Lab 08/01/14 1353 08/01/14 1617 08/02/14 0022 08/02/14 0423 08/02/14 0727  GLUCAP 150* 170* 186* 159* 126*  [name],[name] [facility] Text page [email or web address], password [email or web address] 08/02/2014, 9:00 AM  LOS: 2 days

## 2014-08-04 NOTE — Progress Notes (Signed)
hgb 6.9. Will transfuse 1 unit prbc. Will round later today.  Crista Curborinna Mars Scheaffer, MD

## 2014-08-04 NOTE — Progress Notes (Signed)
Notified T. Claiborne Billingsallahan, on call for Dr. Lendell CapriceSullivan, of critical hemoglobin 6.9. No new orders received.   Lowella DellHudson, Michaelangelo Mittelman G 08/04/2014 4:38 AM

## 2014-08-04 NOTE — Progress Notes (Signed)
Subjective: Pt stable - says he is feeling fine   Objective: Vital signs in last 24 hours: Temp:  [99.2 F (37.3 C)-99.4 F (37.4 C)] 99.2 F (37.3 C) (11/28 0643) Pulse Rate:  [77-86] 82 (11/28 0643) Resp:  [16] 16 (11/28 0643) BP: (104-117)/(50-60) 112/52 mmHg (11/28 0643) SpO2:  [95 %-96 %] 96 % (11/28 0643)  Intake/Output from previous day: 11/27 0701 - 11/28 0700 In: 720 [P.O.:720] Out: 700 [Urine:700] Intake/Output this shift:    Exam:  Sensation intact distally Intact pulses distally  Labs:  Recent Labs  08/02/14 0403 08/03/14 0347 08/04/14 0351  HGB 9.0* 7.4* 6.9*    Recent Labs  08/03/14 0347 08/04/14 0351  WBC 10.8* 13.2*  RBC 2.29* 2.23*  HCT 20.6* 19.5*  PLT 135* 154    Recent Labs  08/02/14 0403 08/03/14 0347  NA 135* 131*  K 3.9 3.4*  CL 96 91*  CO2 26 30  BUN 19 15  CREATININE 1.25 1.29  GLUCOSE 139* 128*  CALCIUM 8.2* 8.6   No results for input(s): LABPT, INR in the last 72 hours.  Assessment/Plan: Pt stable - pain ok - continue to mobilize   [facility] NashuaDEAN,GREGORY SCOTT 08/04/2014, 8:29 AM

## 2014-08-05 LAB — CBC
HEMATOCRIT: 22.4 % — AB (ref 39.0–52.0)
Hemoglobin: 7.7 g/dL — ABNORMAL LOW (ref 13.0–17.0)
MCH: 29.4 pg (ref 26.0–34.0)
MCHC: 34.4 g/dL (ref 30.0–36.0)
MCV: 85.5 fL (ref 78.0–100.0)
Platelets: 182 10*3/uL (ref 150–400)
RBC: 2.62 MIL/uL — ABNORMAL LOW (ref 4.22–5.81)
RDW: 15.1 % (ref 11.5–15.5)
WBC: 9.5 10*3/uL (ref 4.0–10.5)

## 2014-08-05 LAB — TYPE AND SCREEN
ABO/RH(D): O POS
Antibody Screen: NEGATIVE
Unit division: 0

## 2014-08-05 LAB — BASIC METABOLIC PANEL
ANION GAP: 14 (ref 5–15)
BUN: 18 mg/dL (ref 6–23)
CO2: 27 meq/L (ref 19–32)
CREATININE: 1.25 mg/dL (ref 0.50–1.35)
Calcium: 8.6 mg/dL (ref 8.4–10.5)
Chloride: 90 mEq/L — ABNORMAL LOW (ref 96–112)
GFR calc Af Amer: 57 mL/min — ABNORMAL LOW (ref >90)
GFR calc non Af Amer: 50 mL/min — ABNORMAL LOW (ref >90)
Glucose, Bld: 95 mg/dL (ref 70–99)
Potassium: 3.6 mEq/L — ABNORMAL LOW (ref 3.7–5.3)
Sodium: 131 mEq/L — ABNORMAL LOW (ref 137–147)

## 2014-08-05 LAB — GLUCOSE, CAPILLARY
GLUCOSE-CAPILLARY: 124 mg/dL — AB (ref 70–99)
Glucose-Capillary: 116 mg/dL — ABNORMAL HIGH (ref 70–99)
Glucose-Capillary: 108 mg/dL — ABNORMAL HIGH (ref 70–99)
Glucose-Capillary: 144 mg/dL — ABNORMAL HIGH (ref 70–99)

## 2014-08-05 MED ORDER — SENNA 8.6 MG PO TABS: 1.0000 | ORAL_TABLET | Freq: Two times a day (BID) | ORAL | Status: AC

## 2014-08-05 MED ORDER — ACETAMINOPHEN 325 MG PO TABS: 650.0000 mg | ORAL_TABLET | Freq: Four times a day (QID) | ORAL | Status: DC | PRN

## 2014-08-05 MED ORDER — POTASSIUM CHLORIDE CRYS ER 20 MEQ PO TBCR
40.0000 meq | EXTENDED_RELEASE_TABLET | Freq: Once | ORAL | Status: AC
  Administered 2014-08-05: 40 meq via ORAL
  Filled 2014-08-05: qty 2

## 2014-08-05 MED ORDER — ONDANSETRON HCL 4 MG PO TABS: 4.0000 mg | ORAL_TABLET | Freq: Four times a day (QID) | ORAL | Status: AC | PRN

## 2014-08-05 NOTE — Discharge Summary (Signed)
Physician Discharge Summary  [name] [id number] DOB: [birth year] DOA: 07/31/2014  PCP: [name], [name], MD  Admit date: 07/31/2014 Discharge date: 08/06/2014  Time spent: greater than 30 minutes  Recommendations for Outpatient Follow-up:  1. Monitor CBC weekly while on lovenox  Discharge Diagnoses:  Active Problems:   Right Hip fracture   Diabetes mellitus type 2, controlled   Essential hypertension   Protein-calorie malnutrition, severe   UTI (urinary tract infection)   Postoperative anemia   Discharge Condition: stable  Filed Weights   08/02/14 1000  Weight: 54.432 kg (120 lb)    History of present illness:  80 y.o. male with history of diabetes mellitus and hypertension had a fall after tripping. Patient states his shoe slipped and he fell. Denies hitting his head onto the floor or losing consciousness. Denies any chest pain or shortness of breath or palpitations. In the ER x-rays revealed a right hip fracture and on-call surgeon Dr. Arthur HolmsNorrins orthopedics was consulted. Patient at this time is being admitted for further management of his right hip fracture.   Hospital Course:   Hip fracture, s/p IM implant by Dr. Roda ShuttersXu 11/26. Started on lovenox postoperatively.  Worked with PT/OT and will go to SNF for rehab.     Diabetes mellitus type 2, remained controlled   Essential hypertension remained controlled   UTI (urinary tract infection): UA consistent with UTI. Culture showed multiple bacterial morphotypes. Completed  3 days bactrim.  Postoperative anemia, hgb dropped 6.9. Transfused 1 unit pRBC. hgb at discharge 7.7  Hypokalemia: corrected   Procedures:  Right hip IM implant  Consultations:  orthopedics  Discharge Exam: Filed Vitals:   08/05/14 1639  BP:   Pulse: 78  Temp: 99.9 F (37.7 C)  Resp: 16    General: asleep. arousable Cardiovascular: RRR Respiratory: CTA abd s, nt, nd Ext no CCE. Dressing CDI  Discharge Instructions    Diet  - low sodium heart healthy    Complete by:  As directed      Diet Carb Modified    Complete by:  As directed      Partial weight bearing    Complete by:  As directed   % Body Weight:  50  Laterality:  right  Extremity:  Lower          Current Discharge Medication List    START taking these medications   Details  acetaminophen (TYLENOL) 325 MG tablet Take 2 tablets (650 mg total) by mouth every 6 (six) hours as needed for mild pain (or Fever >/= 101).    enoxaparin (LOVENOX) 40 MG/0.4ML injection Inject 0.4 mLs (40 mg total) into the skin daily. Qty: 14 Syringe, Refills: 0    ondansetron (ZOFRAN) 4 MG tablet Take 1 tablet (4 mg total) by mouth every 6 (six) hours as needed for nausea. Qty: 20 tablet, Refills: 0    oxyCODONE (OXY IR/ROXICODONE) 5 MG immediate release tablet Take 1-3 tablets (5-15 mg total) by mouth every 4 (four) hours as needed. Qty: 90 tablet, Refills: 0    senna (SENOKOT) 8.6 MG TABS tablet Take 1 tablet (8.6 mg total) by mouth 2 (two) times daily. Qty: 120 each, Refills: 0      CONTINUE these medications which have NOT CHANGED   Details  atenolol (TENORMIN) 25 MG tablet Take 25 mg by mouth daily.    glyBURIDE (DIABETA) 5 MG tablet Take 5 mg by mouth daily.    Multiple Vitamin (MULTIVITAMIN WITH MINERALS) TABS tablet Take 1 tablet by  mouth daily.       No Known Allergies Follow-up Information    Follow up with Cheral Almas, MD In 2 weeks.   Specialty:  Orthopedic Surgery   Why:  For suture removal, For wound re-check   Contact information:   [address] [place] [address] [phone]        The results of significant diagnostics from this hospitalization (including imaging, microbiology, ancillary and laboratory) are listed below for reference.    Significant Diagnostic Studies: Dg Chest 1 View  08/01/2014   CLINICAL DATA:  Hypertension and diabetes.  Tripped and fall.  EXAM: CHEST - 1 VIEW  COMPARISON:  None.   FINDINGS: Normal heart size. Calcified atherosclerotic disease involves the aortic arch. No pleural effusion or edema. Lung volumes are low. No airspace consolidation.  IMPRESSION: 1. No acute findings. 2. Atherosclerotic disease noted.   Electronically Signed   By: Signa Kell M.D.   On: 08/01/2014 01:00   Dg Hip Complete Right  08/01/2014   CLINICAL DATA:  Fall with right hip pain  EXAM: RIGHT HIP - COMPLETE 2+ VIEW  COMPARISON:  None.  FINDINGS: There is a spiral fracture of the proximal right femur, extending from the upper diaphysis into the lesser trochanteric region. The lesser trochanter is displaced proximally. No fracture identified across the intertrochanteric line. Both hips are located. No evidence of pelvic ring fracture. Osteopenia which is profound. Diffuse arterial calcification.  IMPRESSION: Sub- and lesser trochanteric femur fracture on the right.   Electronically Signed   By: Tiburcio Pea M.D.   On: 08/01/2014 00:46   Dg Femur Right  08/01/2014   CLINICAL DATA:  ORIF RIGHT femur  EXAM: DG C-ARM 1-60 MIN - NRPT [facility]; RIGHT FEMUR - 2 VIEW  COMPARISON:  07/31/2014 radiographs  FLUOROSCOPY TIME:  2 min 4 seconds  FINDINGS: Long IM nail with proximal and distal screws identified in RIGHT femur post ORIF of displaced subtrochanteric and lesser trochanteric fractures of the RIGHT femur.  Fracture appears grossly reduced.  Bones demineralized.  No dislocation.  Extensive atherosclerotic calcifications noted.  IMPRESSION: Post ORIF of a proximal RIGHT femoral fracture.   Electronically Signed   By: Ulyses Southward M.D.   On: 08/01/2014 15:59   Dg C-arm 1-60 Min-no Report  08/01/2014   CLINICAL DATA:  ORIF RIGHT femur  EXAM: DG C-ARM 1-60 MIN - [facility]; RIGHT FEMUR - 2 VIEW  COMPARISON:  07/31/2014 radiographs  FLUOROSCOPY TIME:  2 min 4 seconds  FINDINGS: Long IM nail with proximal and distal screws identified in RIGHT femur post ORIF of displaced subtrochanteric and lesser trochanteric  fractures of the RIGHT femur.  Fracture appears grossly reduced.  Bones demineralized.  No dislocation.  Extensive atherosclerotic calcifications noted.  IMPRESSION: Post ORIF of a proximal RIGHT femoral fracture.   Electronically Signed   By: Ulyses Southward M.D.   On: 08/01/2014 15:59    Microbiology: Recent Results (from the past 240 hour(s))  Urine culture     Status: None   Collection Time: 08/01/14  5:18 AM  Result Value Ref Range Status   Specimen Description URINE, CLEAN CATCH  Final   Special Requests NONE  Final   Culture  Setup Time   Final    08/01/2014 10:29 Performed at [facility]   Final    >=100,000 COLONIES/ML Performed at [facility]    Culture   Final    Multiple bacterial  morphotypes present, none predominant. Suggest appropriate recollection if clinically indicated. [facility]    Report Status 08/02/2014 FINAL  Final  Surgical pcr screen     Status: None   Collection Time: 08/01/14  8:57 AM  Result Value Ref Range Status   MRSA, PCR NEGATIVE NEGATIVE Final   Staphylococcus aureus NEGATIVE NEGATIVE Final    Comment:        The Xpert SA Assay (FDA approved for NASAL specimens in patients over 78 years of age), is one component of a comprehensive surveillance program.  Test performance has been validated by [facility] for patients greater than or equal to 78 year old. It is not intended to diagnose infection nor to guide or monitor treatment.      Labs: Basic Metabolic Panel:  Recent Labs Lab 08/01/14 0034 08/01/14 0531 08/02/14 0403 08/03/14 0347 08/05/14 0330  NA 133* 132* 135* 131* 131*  K 3.8 4.2 3.9 3.4* 3.6*  CL 89* 90* 96 91* 90*  CO2 28 27 26 30 27   GLUCOSE 211* 215* 139* 128* 95  BUN 16 17 19 15 18   CREATININE 1.13 1.09 1.25 1.29 1.25  CALCIUM 9.6 9.4 8.2* 8.6 8.6   Liver Function Tests:  Recent Labs Lab 08/01/14 0034 08/01/14 0531  AST 36 32  ALT 28 27  ALKPHOS  144* 127*  BILITOT 0.4 0.4  PROT 7.6 7.0  ALBUMIN 3.8 3.5   No results for input(s): LIPASE, AMYLASE in the last 168 hours. No results for input(s): AMMONIA in the last 168 hours. CBC:  Recent Labs Lab 08/01/14 0034 08/01/14 0531 08/02/14 0403 08/03/14 0347 08/04/14 0351 08/05/14 0330  WBC 17.2* 17.8* 12.8* 10.8* 13.2* 9.5  NEUTROABS 15.1* 16.0*  --   --   --   --   HGB 15.3 13.4 9.0* 7.4* 6.9* 7.7*  HCT 44.6 40.4 25.8* 20.6* 19.5* 22.4*  MCV 91.6 92.0 91.2 90.0 87.4 85.5  PLT 205 218 167 135* 154 182   Cardiac Enzymes: No results for input(s): CKTOTAL, CKMB, CKMBINDEX, TROPONINI in the last 168 hours. BNP: BNP (last 3 results) No results for input(s): PROBNP in the last 8760 hours. CBG:  Recent Labs Lab 08/04/14 1648 08/04/14 2040 08/05/14 0643 08/05/14 1149 08/05/14 1652  GLUCAP 123* 113* 116* 108* 124*    Signed:  Jw Covin L  [facility] 08/05/2014, 5:40 PM

## 2014-08-05 NOTE — Clinical Social Work Note (Signed)
CSW met with patient at bedside and reviewed bed offers.  Patient chooses [facility] SNF at time of discharge.  Patient will be transported via [place].  Projected dc: Monday.    Nonnie Done, [place] [phone]  Psychiatric & Orthopedics (5N 1-16) Clinical Social Worker

## 2014-08-05 NOTE — Progress Notes (Signed)
pateint stable Min pain with ble prom Mobilize with PT - placement pending

## 2014-08-06 ENCOUNTER — Encounter (HOSPITAL_COMMUNITY): Payer: Self-pay | Admitting: Orthopaedic Surgery

## 2014-08-06 LAB — GLUCOSE, CAPILLARY
GLUCOSE-CAPILLARY: 140 mg/dL — AB (ref 70–99)
Glucose-Capillary: 117 mg/dL — ABNORMAL HIGH (ref 70–99)

## 2014-08-06 NOTE — Clinical Social Work Note (Signed)
Pt to be [facility]. Pt and pt's son, Georgeanna HarrisonSolomon Bene JR [phone]), updated. - Message left for pt's son and pt's request.  [facility] Report number: [id number] Transportation: Elon SpannerTAR  Shakara Tweedy, [place] [phone]) Licensed Clinical Social Worker Orthopedics [id number]) and Surgical [phone]([id number])

## 2014-08-06 NOTE — Progress Notes (Signed)
Physical Therapy Treatment Patient Details Name: [name] MRN: [id number] DOB: 1924-03-24 Today's Date: 08/06/2014    History of Present Illness 80 y.o. [facility] s/p fall with resultant left femur fx.  Pt is now s/p IM Nail and has been made PWB 50% on his right leg.  Pt with significant PMHx of DM and HTN.      PT Comments    Pt is progressing well with his mobility, min/mod assist with RW for in room gait.  He doesn't have any assistance at home and is still best served by going SNF for rehab before returning home alone as he needs to be at a mod I level of mobility to do so safely.  PT will continue to follow acutely.   Follow Up Recommendations  SNF     Equipment Recommendations  Rolling walker with 5" wheels    Recommendations for Other Services   NA     Precautions / Restrictions Precautions Precautions: Fall Restrictions Weight Bearing Restrictions: Yes RLE Weight Bearing: Partial weight bearing RLE Partial Weight Bearing Percentage or Pounds: 50%    Mobility  Bed Mobility Overal bed mobility: Needs Assistance Bed Mobility: Supine to Sit     Supine to sit: Mod assist     General bed mobility comments: Mod one person assist to help progress right leg and support trunk during transition to EOB.  Pt using, but pushing posterioly against the bed rail.  He did much better once sitting with feet supported on the ground.   Transfers Overall transfer level: Needs assistance Equipment used: Rolling walker (2 wheeled) Transfers: Sit to/from Stand Sit to Stand: Mod assist;From elevated surface         General transfer comment: Mod assist to support trunk and stabilize RW during transitions.  Pt has a tendancy towards posterior preference until forward momentum is established with gait.  Verbal cues for safe hand placement.   Ambulation/Gait Ambulation/Gait assistance: Min assist Ambulation Distance (Feet): 20 Feet Assistive device:  Rolling walker (2 wheeled) Gait Pattern/deviations: Step-to pattern;Antalgic;Trunk flexed Gait velocity: decreased Gait velocity interpretation: Below normal speed for age/gender General Gait Details: Min assist to support trunk while walking for balance and to help steer RW.  Verbal cues for safe RW use, upright posture and correct leg sequencing.            Balance Overall balance assessment: Needs assistance Sitting-balance support: Feet supported;Bilateral upper extremity supported;Feet unsupported Sitting balance-Leahy Scale: Poor Sitting balance - Comments: Poor with feet unsupported (posterior LOB in bed with max assist to maintaing sitting).  Once feet supported pt could be up to good sitting balance EOB with one or no upper extremity support.  Postural control: Posterior lean                          Cognition Arousal/Alertness: Awake/alert Behavior During Therapy: WFL for tasks assessed/performed Overall Cognitive Status: Within Functional Limits for tasks assessed                      Exercises Total Joint Exercises Ankle Circles/Pumps: AROM;Both;20 reps;Supine Quad Sets: AROM;Right;10 reps;Supine Short Arc Quad: AROM;Right;10 reps;Supine Heel Slides: AAROM;Right;10 reps;Supine Hip ABduction/ADduction: AAROM;Right;10 reps;Supine Long Arc Quad: AROM;Right;10 reps;Supine        Pertinent Vitals/Pain Pain Assessment: 0-10 Pain Score: 5  Pain Location: right leg Pain Descriptors / Indicators: Aching Pain Intervention(s): Limited activity within patient's tolerance;Monitored during session;Premedicated before session;Repositioned  PT Goals (current goals can now be found in the care plan section) Acute Rehab PT Goals Patient Stated Goal: to get "professional care" before going back home by himself.  Progress towards PT goals: Progressing toward goals    Frequency  Min 3X/week    PT Plan Current plan remains appropriate        End of Session Equipment Utilized During Treatment: Gait belt Activity Tolerance: Patient limited by pain Patient left: in chair;with call bell/phone within reach     Time: 1011-1053 PT Time Calculation (min) (ACUTE ONLY): 42 min  Charges:  $Gait Training: 8-22 mins $Therapeutic Exercise: 8-22 mins                      Vinny Taranto B. Lorianne Malbrough, PT, DPT [phone]   08/06/2014, 10:58 AM

## 2014-08-09 ENCOUNTER — Non-Acute Institutional Stay (SKILLED_NURSING_FACILITY): Payer: Medicare Other | Admitting: Internal Medicine

## 2014-08-09 DIAGNOSIS — I10 Essential (primary) hypertension: Secondary | ICD-10-CM

## 2014-08-09 DIAGNOSIS — E119 Type 2 diabetes mellitus without complications: Secondary | ICD-10-CM

## 2014-08-09 DIAGNOSIS — S72001D Fracture of unspecified part of neck of right femur, subsequent encounter for closed fracture with routine healing: Secondary | ICD-10-CM

## 2014-08-09 DIAGNOSIS — N390 Urinary tract infection, site not specified: Secondary | ICD-10-CM | POA: Diagnosis not present

## 2014-08-09 DIAGNOSIS — D649 Anemia, unspecified: Secondary | ICD-10-CM | POA: Diagnosis not present

## 2014-08-09 NOTE — Progress Notes (Signed)
MRN: [id number] Name: [name]  Sex: male Age: 80 y.o. DOB: [birth year]  PSC #: [name] Facility/Room: 211 Level Of Care: SNF Provider: Merrilee SeashoreALEXANDER, Aubrina Nieman D Emergency Contacts: Extended Emergency Contact Information Primary Emergency Contact: Mathis,Jesse  [place] Home Phone: [phone] Relation: Daughter Secondary Emergency Contact: Mathis,Jesse Address: [address]          [place], [place] [id number] [place] Home Phone: [phone] Relation: Nephew  Code Status: FULL  Allergies: Review of patient's allergies indicates no known allergies.  Chief Complaint  Patient presents with  . New Admit To SNF    HPI: Patient is 80 y.o. male who is admitted to SNF for OT/PT after hip surgery for a R hip fracture.  Past Medical History  Diagnosis Date  . Diabetes mellitus without complication   . Hypertension     Past Surgical History  Procedure Laterality Date  . Hip fracture surgery Right 08/01/2014    IM RIGHT HIP  . Femur im nail Right 08/01/2014    Procedure: INTRAMEDULLARY (IM) NAIL FEMORAL;  Surgeon: Cheral AlmasNaiping Michael Xu, MD;  Location: [facility] OR;  Service: Orthopedics;  Laterality: Right;      Medication List       This list is accurate as of: 08/09/14 11:59 PM.  Always use your most recent med list.               acetaminophen 325 MG tablet  Commonly known as:  TYLENOL  Take 2 tablets (650 mg total) by mouth every 6 (six) hours as needed for mild pain (or Fever >/= 101).     atenolol 25 MG tablet  Commonly known as:  TENORMIN  Take 25 mg by mouth daily.     enoxaparin 40 MG/0.4ML injection  Commonly known as:  LOVENOX  Inject 0.4 mLs (40 mg total) into the skin daily.     glyBURIDE 5 MG tablet  Commonly known as:  DIABETA  Take 5 mg by mouth daily.     multivitamin with minerals Tabs tablet  Take 1 tablet by mouth daily.     ondansetron 4 MG tablet  Commonly known as:  ZOFRAN  Take 1 tablet (4 mg total) by  mouth every 6 (six) hours as needed for nausea.     oxyCODONE 5 MG immediate release tablet  Commonly known as:  Oxy IR/ROXICODONE  Take 1-3 tablets (5-15 mg total) by mouth every 4 (four) hours as needed.     senna 8.6 MG Tabs tablet  Commonly known as:  SENOKOT  Take 1 tablet (8.6 mg total) by mouth 2 (two) times daily.        No orders of the defined types were placed in this encounter.     There is no immunization history on file for this patient.  History  Substance Use Topics  . Smoking status: Former Games developermoker  . Smokeless tobacco: Never Used     Comment: QUIT SMOKING IN 1963  . Alcohol Use: No    Family history is noncontributory    Review of Systems  DATA OBTAINED: from patient GENERAL:  no fevers, fatigue, appetite changes SKIN: No itching, rash or wounds EYES: No eye pain, redness, discharge EARS: No earache, tinnitus, change in hearing NOSE: No congestion, drainage or bleeding  MOUTH/THROAT: No mouth or tooth pain, No sore throat RESPIRATORY: No cough, wheezing, SOB CARDIAC: No chest pain, palpitations, lower extremity edema  GI: No abdominal pain, No N/V/D or constipation, No heartburn or reflux  GU: No dysuria, frequency or urgency, or incontinence  MUSCULOSKELETAL: No unrelieved bone/joint pain NEUROLOGIC: No headache, dizziness or focal weakness PSYCHIATRIC: No overt anxiety or sadness, No behavior issue.   Filed Vitals:   08/09/14 2049  BP: 108/63  Pulse: 52  Temp: 97.1 F (36.2 C)  Resp: 16    Physical Exam  GENERAL APPEARANCE: Alert, conversant,  No acute distress.  SKIN: No diaphoresis rash HEAD: Normocephalic, atraumatic  EYES: Conjunctiva/lids clear. Pupils round, reactive. EOMs intact.  EARS: External exam WNL, canals clear. Hearing grossly normal.  NOSE: No deformity or discharge.  MOUTH/THROAT: Lips w/o lesions  RESPIRATORY: Breathing is even, unlabored. Lung sounds are clear   CARDIOVASCULAR: Heart RRR no murmurs, rubs or  gallops. No peripheral edema.   GASTROINTESTINAL: Abdomen is soft, non-tender, not distended w/ normal bowel sounds. GENITOURINARY: Bladder non tender, not distended  MUSCULOSKELETAL: No abnormal joints or musculature NEUROLOGIC:  Cranial nerves 2-12 grossly intact  PSYCHIATRIC: Mood and affect appropriate to situation, no behavioral issues  Patient Active Problem List   Diagnosis Date Noted  . Protein-calorie malnutrition, severe 08/02/2014  . UTI (urinary tract infection) 08/02/2014  . Postoperative anemia 08/02/2014  . Hip fracture requiring operative repair 08/01/2014  . Hip fracture 08/01/2014  . Diabetes mellitus type 2, controlled 08/01/2014  . Essential hypertension 08/01/2014    CBC    Component Value Date/Time   WBC 9.5 08/05/2014 0330   RBC 2.62* 08/05/2014 0330   HGB 7.7* 08/05/2014 0330   HCT 22.4* 08/05/2014 0330   PLT 182 08/05/2014 0330   MCV 85.5 08/05/2014 0330   LYMPHSABS 1.1 08/01/2014 0531   MONOABS 0.7 08/01/2014 0531   EOSABS 0.0 08/01/2014 0531   BASOSABS 0.0 08/01/2014 0531    CMP     Component Value Date/Time   NA 131* 08/05/2014 0330   K 3.6* 08/05/2014 0330   CL 90* 08/05/2014 0330   CO2 27 08/05/2014 0330   GLUCOSE 95 08/05/2014 0330   BUN 18 08/05/2014 0330   CREATININE 1.25 08/05/2014 0330   CALCIUM 8.6 08/05/2014 0330   PROT 7.0 08/01/2014 0531   ALBUMIN 3.5 08/01/2014 0531   AST 32 08/01/2014 0531   ALT 27 08/01/2014 0531   ALKPHOS 127* 08/01/2014 0531   BILITOT 0.4 08/01/2014 0531   GFRNONAA 50* 08/05/2014 0330   GFRAA 57* 08/05/2014 0330    Assessment and Plan  Hip fracture requiring operative repair , s/p IM implant by Dr. Roda ShuttersXu 11/26. Started on lovenox postoperatively. Worked with PT/OT and will go to SNF for rehab.    UTI (urinary tract infection) UA consistent with UTI. Culture showed multiple bacterial morphotypes. Completed 3 days bactrim.  Postoperative anemia hgb dropped 6.9. Transfused 1 unit pRBC. hgb at  discharge 7.7   Essential hypertension Controlled with atenolol  Diabetes mellitus type 2, controlled Controlled with glyburide 5 mg    Jesse HanksALEXANDER, Jesse Howser D, MD

## 2014-08-16 ENCOUNTER — Encounter: Payer: Self-pay | Admitting: Internal Medicine

## 2014-08-16 NOTE — Assessment & Plan Note (Signed)
hgb dropped 6.9. Transfused 1 unit pRBC. hgb at discharge 7.7

## 2014-08-16 NOTE — Assessment & Plan Note (Signed)
Controlled with glyburide 5 mg

## 2014-08-16 NOTE — Assessment & Plan Note (Signed)
UA consistent with UTI. Culture showed multiple bacterial morphotypes. Completed 3 days bactrim.

## 2014-08-16 NOTE — Assessment & Plan Note (Signed)
Controlled with atenolol 

## 2014-08-16 NOTE — Assessment & Plan Note (Signed)
,   s/p IM implant by Dr. Roda ShuttersXu 11/26. Started on lovenox postoperatively. Worked with PT/OT and will go to SNF for rehab.

## 2014-08-24 ENCOUNTER — Non-Acute Institutional Stay (SKILLED_NURSING_FACILITY): Payer: Medicare Other | Admitting: Nurse Practitioner

## 2014-08-24 DIAGNOSIS — S72001D Fracture of unspecified part of neck of right femur, subsequent encounter for closed fracture with routine healing: Secondary | ICD-10-CM

## 2014-08-24 DIAGNOSIS — H60392 Other infective otitis externa, left ear: Secondary | ICD-10-CM | POA: Diagnosis not present

## 2014-08-24 NOTE — Progress Notes (Signed)
Patient ID: Jesse AndersonSolomon Biehl, male   DOB: 01/13/1924, 80 y.o.   MRN: [id number]    Nursing Home Location:  [facility]   Place of Service: SNF (31)  PCP: [name], [name], MD  No Known Allergies  Chief Complaint  Patient presents with  . Acute Visit    ear stopped up    HPI:  Patient is a 80 y.o. male seen today at [facility] per nursing request. Pt has been complaining of stopped up ear for several days and staff would like this evaluated. Pt complains of drainage and tenderness to left ear. Hx of ear infections and he reports he needs some drops. No fever or chills. Full feeling in ear.    Review of Systems:  Review of Systems  Constitutional: Negative for activity change, appetite change, fatigue and unexpected weight change.  HENT: Positive for ear discharge and ear pain. Negative for congestion, facial swelling, hearing loss, mouth sores, nosebleeds, postnasal drip, rhinorrhea, sinus pressure, sneezing, sore throat and tinnitus.   Eyes: Negative.   Respiratory: Negative for cough and shortness of breath.   Cardiovascular: Negative for chest pain, palpitations and leg swelling.  Gastrointestinal: Negative for abdominal pain, diarrhea and constipation.  Genitourinary: Negative for dysuria and difficulty urinating.  Musculoskeletal: Negative for myalgias and arthralgias.  Skin: Negative for color change and wound.  Neurological: Negative for dizziness and weakness.  Psychiatric/Behavioral: Negative for behavioral problems, confusion and agitation.    Past Medical History  Diagnosis Date  . Diabetes mellitus without complication   . Hypertension    Past Surgical History  Procedure Laterality Date  . Hip fracture surgery Right 08/01/2014    IM RIGHT HIP  . Femur im nail Right 08/01/2014    Procedure: INTRAMEDULLARY (IM) NAIL FEMORAL;  Surgeon: Cheral AlmasNaiping Michael Xu, MD;  Location: [facility] OR;  Service: Orthopedics;  Laterality: Right;   Social  History:   reports that he has quit smoking. He has never used smokeless tobacco. He reports that he does not drink alcohol or use illicit drugs.  Family History  Problem Relation Age of Onset  . Other Neg Hx     Medications: Patient's Medications  New Prescriptions   No medications on file  Previous Medications   ACETAMINOPHEN (TYLENOL) 325 MG TABLET    Take 2 tablets (650 mg total) by mouth every 6 (six) hours as needed for mild pain (or Fever >/= 101).   ATENOLOL (TENORMIN) 25 MG TABLET    Take 25 mg by mouth daily.   ENOXAPARIN (LOVENOX) 40 MG/0.4ML INJECTION    Inject 0.4 mLs (40 mg total) into the skin daily.   GLYBURIDE (DIABETA) 5 MG TABLET    Take 5 mg by mouth daily.   MULTIPLE VITAMIN (MULTIVITAMIN WITH MINERALS) TABS TABLET    Take 1 tablet by mouth daily.   ONDANSETRON (ZOFRAN) 4 MG TABLET    Take 1 tablet (4 mg total) by mouth every 6 (six) hours as needed for nausea.   OXYCODONE (OXY IR/ROXICODONE) 5 MG IMMEDIATE RELEASE TABLET    Take 1-3 tablets (5-15 mg total) by mouth every 4 (four) hours as needed.   SENNA (SENOKOT) 8.6 MG TABS TABLET    Take 1 tablet (8.6 mg total) by mouth 2 (two) times daily.  Modified Medications   No medications on file  Discontinued Medications   No medications on file     Physical Exam: Filed Vitals:   08/24/14 1703  BP: 116/72  Pulse: 66  Temp: 97.2 F (36.2 C)  Resp: 20    Physical Exam  Constitutional: No distress.  HENT:  Head: Normocephalic and atraumatic.  Right Ear: Tympanic membrane normal. There is drainage and tenderness.  Left Ear: External ear normal.  Mouth/Throat: Oropharynx is clear and moist. No oropharyngeal exudate.  Eyes: Conjunctivae and EOM are normal. Pupils are equal, round, and reactive to light.  Neck: Normal range of motion. Neck supple.  Cardiovascular: Normal rate, regular rhythm and normal heart sounds.   Pulmonary/Chest: Effort normal and breath sounds normal.  Abdominal: Soft. Bowel sounds are  normal.  Neurological: He is alert.  Skin: Skin is warm and dry. He is not diaphoretic.  Psychiatric: He has a normal mood and affect.    Labs reviewed: Basic Metabolic Panel:  Recent Labs  16/06/9610/26/15 0403 08/03/14 0347 08/05/14 0330  NA 135* 131* 131*  K 3.9 3.4* 3.6*  CL 96 91* 90*  CO2 26 30 27   GLUCOSE 139* 128* 95  BUN 19 15 18   CREATININE 1.25 1.29 1.25  CALCIUM 8.2* 8.6 8.6   Liver Function Tests:  Recent Labs  08/01/14 0034 08/01/14 0531  AST 36 32  ALT 28 27  ALKPHOS 144* 127*  BILITOT 0.4 0.4  PROT 7.6 7.0  ALBUMIN 3.8 3.5   No results for input(s): LIPASE, AMYLASE in the last 8760 hours. No results for input(s): AMMONIA in the last 8760 hours. CBC:  Recent Labs  08/01/14 0034 08/01/14 0531  08/03/14 0347 08/04/14 0351 08/05/14 0330  WBC 17.2* 17.8*  < > 10.8* 13.2* 9.5  NEUTROABS 15.1* 16.0*  --   --   --   --   HGB 15.3 13.4  < > 7.4* 6.9* 7.7*  HCT 44.6 40.4  < > 20.6* 19.5* 22.4*  MCV 91.6 92.0  < > 90.0 87.4 85.5  PLT 205 218  < > 135* 154 182  < > = values in this interval not displayed. TSH: No results for input(s): TSH in the last 8760 hours. A1C: No results found for: HGBA1C Lipid Panel: No results for input(s): CHOL, HDL, LDLCALC, TRIG, CHOLHDL, LDLDIRECT in the last 8760 hours.    Assessment/Plan 1. Hip fracture requiring operative repair, right, closed, with routine healing, subsequent encounter -surgery on 11/25, remains on lovenox for prophylactic, nursing to call and clarify with ortho on duration of lovenox   2. Otitis, externa, infective, left -ofloxacin 10 gtts into left ear for 7 days

## 2014-09-03 NOTE — Addendum Note (Signed)
Addended by: Sharon SellerEUBANKS, Kendalynn Wideman K on: 09/03/2014 03:09 PM   Modules accepted: Level of Service

## 2014-09-18 ENCOUNTER — Encounter (HOSPITAL_COMMUNITY): Payer: Self-pay | Admitting: *Deleted

## 2014-09-18 ENCOUNTER — Emergency Department (HOSPITAL_COMMUNITY)
Admission: EM | Admit: 2014-09-18 | Discharge: 2014-09-18 | Disposition: A | Discharge status: Home / Self Care | Payer: Medicare Other | Attending: Emergency Medicine | Admitting: Emergency Medicine

## 2014-09-18 DIAGNOSIS — Z79899 Other long term (current) drug therapy: Secondary | ICD-10-CM | POA: Diagnosis not present

## 2014-09-18 DIAGNOSIS — I10 Essential (primary) hypertension: Secondary | ICD-10-CM | POA: Diagnosis not present

## 2014-09-18 DIAGNOSIS — Z7982 Long term (current) use of aspirin: Secondary | ICD-10-CM | POA: Insufficient documentation

## 2014-09-18 DIAGNOSIS — Z87891 Personal history of nicotine dependence: Secondary | ICD-10-CM | POA: Diagnosis not present

## 2014-09-18 DIAGNOSIS — E119 Type 2 diabetes mellitus without complications: Secondary | ICD-10-CM | POA: Diagnosis not present

## 2014-09-18 DIAGNOSIS — R040 Epistaxis: Secondary | ICD-10-CM | POA: Diagnosis not present

## 2014-09-18 LAB — CBC
HEMATOCRIT: 38.8 % — AB (ref 39.0–52.0)
HEMOGLOBIN: 12.6 g/dL — AB (ref 13.0–17.0)
MCH: 31 pg (ref 26.0–34.0)
MCHC: 32.5 g/dL (ref 30.0–36.0)
MCV: 95.6 fL (ref 78.0–100.0)
PLATELETS: 244 10*3/uL (ref 150–400)
RBC: 4.06 MIL/uL — ABNORMAL LOW (ref 4.22–5.81)
RDW: 14.4 % (ref 11.5–15.5)
WBC: 10.7 10*3/uL — ABNORMAL HIGH (ref 4.0–10.5)

## 2014-09-18 MED ORDER — CEPHALEXIN 500 MG PO CAPS: 500.0000 mg | ORAL_CAPSULE | Freq: Two times a day (BID) | ORAL | Status: DC

## 2014-09-18 MED ORDER — TRAMADOL HCL 50 MG PO TABS: 50.0000 mg | ORAL_TABLET | Freq: Four times a day (QID) | ORAL | Status: DC | PRN

## 2014-09-18 MED ORDER — LIDOCAINE-EPINEPHRINE (PF) 2 %-1:200000 IJ SOLN
10.0000 mL | Freq: Once | INTRAMUSCULAR | Status: AC
  Administered 2014-09-18: 10 mL
  Filled 2014-09-18: qty 20

## 2014-09-18 NOTE — ED Notes (Signed)
Hyacinth MeekerMiller, Md at bedside d/t pts nose bleeding post coughing, will continue to monitor pt

## 2014-09-18 NOTE — ED Notes (Signed)
Pt leaves via [facility] to return to [facility]. Pt is stable upon d/c and d/c instructions were reviewed with nursing staff at [facility] and they verbalized understanding rt f/u care.

## 2014-09-18 NOTE — ED Notes (Signed)
[facility] paged. 

## 2014-09-18 NOTE — ED Notes (Signed)
Jesse PeonErin, Rn made aware by [facility] that pt has small amt of bleeding from nose, Jesse PalmsYelverton, MD made aware by Jesse PeonErin, RN, per Esaw GrandchildErin, Rn [facility] aware of d/c papers & f/u care with ENT, pt instructed to not blow his nose & to place pressure on the area, bleeding controlled at this time

## 2014-09-18 NOTE — ED Notes (Addendum)
Called report to [facility]

## 2014-09-18 NOTE — Discharge Instructions (Signed)

## 2014-09-18 NOTE — ED Notes (Signed)
Pt. Has a non-traumatic nose bleed starting last night at 10pm. The bleeding stopped for a little while pt went to sleep and was woken up by the bleed. Not on blood thinners currently. [facility] recently for a hip and femur fx.

## 2014-09-18 NOTE — ED Provider Notes (Signed)
CSN: [id number]     Arrival date & time 09/18/14  [id number] History   First MD Initiated Contact with Patient 09/18/14 0539     Chief Complaint  Patient presents with  . Epistaxis     (Consider location/radiation/quality/duration/timing/severity/associated sxs/prior Treatment) HPI Patient since with epistaxis since 10 PM. Bleeding is been from the right near. Patient states the bleeding stops momentarily but woke him from sleep. Not currently on any blood thinners. Denies any lightheadedness or syncope. Past Medical History  Diagnosis Date  . Diabetes mellitus without complication   . Hypertension    Past Surgical History  Procedure Laterality Date  . Hip fracture surgery Right 08/01/2014    IM RIGHT HIP  . Femur im nail Right 08/01/2014    Procedure: INTRAMEDULLARY (IM) NAIL FEMORAL;  Surgeon: Cheral AlmasNaiping Michael Xu, MD;  Location: [facility] OR;  Service: Orthopedics;  Laterality: Right;   Family History  Problem Relation Age of Onset  . Other Neg Hx    History  Substance Use Topics  . Smoking status: Former Games developermoker  . Smokeless tobacco: Never Used     Comment: QUIT SMOKING IN 1963  . Alcohol Use: No    Review of Systems  HENT: Positive for nosebleeds.   Respiratory: Negative for shortness of breath.   Cardiovascular: Negative for chest pain.  Gastrointestinal: Negative for nausea, vomiting and abdominal pain.  Neurological: Negative for dizziness, weakness, light-headedness, numbness and headaches.  All other systems reviewed and are negative.     Allergies  Peanuts  Home Medications   Prior to Admission medications   Medication Sig Start Date End Date Taking? Authorizing Provider  acetaminophen (TYLENOL) 325 MG tablet Take 2 tablets (650 mg total) by mouth every 6 (six) hours as needed for mild pain (or Fever >/= 101). 08/05/14  Yes Christiane Haorinna L [name], MD  acetaminophen (TYLENOL) 650 MG CR tablet Take 650 mg by mouth every 6 (six) hours as needed for pain or fever.   Yes  Historical Provider, MD  aspirin 325 MG tablet Take 325 mg by mouth 2 (two) times daily.   Yes Historical Provider, MD  bisacodyl (DULCOLAX) 10 MG suppository Place 10 mg rectally daily as needed for moderate constipation.   Yes Historical Provider, MD  glyBURIDE (DIABETA) 5 MG tablet Take 5 mg by mouth daily.   Yes Historical Provider, MD  magnesium hydroxide (MILK OF MAGNESIA) 400 MG/5ML suspension Take 30 mLs by mouth daily as needed for mild constipation.   Yes Historical Provider, MD  Multiple Vitamin (MULTIVITAMIN WITH MINERALS) TABS tablet Take 1 tablet by mouth daily.   Yes Historical Provider, MD  ondansetron (ZOFRAN) 4 MG tablet Take 1 tablet (4 mg total) by mouth every 6 (six) hours as needed for nausea. 08/05/14  Yes Christiane Haorinna L [name], MD  oxyCODONE (OXY IR/ROXICODONE) 5 MG immediate release tablet Take 1-3 tablets (5-15 mg total) by mouth every 4 (four) hours as needed. Patient taking differently: Take 5-10 mg by mouth every 4 (four) hours as needed for moderate pain.  08/01/14  Yes Naiping Glee ArvinMichael Xu, MD  polyethylene glycol [facility](MIRALAX / GLYCOLAX) packet Take 17 g by mouth daily.   Yes Historical Provider, MD  senna (SENOKOT) 8.6 MG TABS tablet Take 1 tablet (8.6 mg total) by mouth 2 (two) times daily. 08/05/14  Yes Christiane Haorinna L [name], MD  atenolol (TENORMIN) 25 MG tablet Take 25 mg by mouth daily.    Historical Provider, MD  enoxaparin (LOVENOX) 40 MG/0.4ML injection Inject 0.4 mLs (40  mg total) into the skin daily. Patient not taking: Reported on 09/18/2014 08/01/14   Naiping Glee Arvin, MD   BP 131/54 mmHg  Pulse 87  Temp(Src) 97.8 F (36.6 C) (Oral)  Resp 18  Ht  (1.702 m)  Wt 120 lb (54.432 kg)  BMI 18.79 kg/m2  SpO2 100% Physical Exam  Constitutional: He is oriented to person, place, and time. He appears well-developed and well-nourished. No distress.  HENT:  Head: Normocephalic and atraumatic.  Mouth/Throat: Oropharynx is clear and moist.  Active anterior bleeding  the right nare.  Eyes: EOM are normal. Pupils are equal, round, and reactive to light.  Neck: Normal range of motion. Neck supple.  Cardiovascular: Normal rate and regular rhythm.   Pulmonary/Chest: Effort normal and breath sounds normal. No respiratory distress. He has no wheezes. He has no rales.  Abdominal: Soft. Bowel sounds are normal.  Musculoskeletal: Normal range of motion. He exhibits no edema or tenderness.  Neurological: He is alert and oriented to person, place, and time.  Skin: Skin is warm and dry. No rash noted. No erythema.  Psychiatric: He has a normal mood and affect. His behavior is normal.  Nursing note and vitals reviewed.   ED Course  Procedures (including critical care time) Labs Review Labs Reviewed  CBC - Abnormal; Notable for the following:    WBC 10.7 (*)    RBC 4.06 (*)    Hemoglobin 12.6 (*)    HCT 38.8 (*)    All other components within normal limits    Imaging Review No results found.   EKG Interpretation None      MDM   Final diagnoses:  Epistaxis   We'll hold pressure for 15 minutes and reevaluated.   Bleeding has resolved. We'll check CBC to evaluate hemoglobin and platelets.  Patient given return precautions and follow-up with ENT.  Loren Racer, MD 09/18/14 [phone]

## 2014-09-18 NOTE — ED Provider Notes (Signed)
8:09 AM Nose packed by myself after recurrent epistaxis.  I had pt blow his nose - clot removed, pressure held and packed with pledgets - soaked in lidocaine with epi - hemostasis obtained - no obvious source, will recheck in 20 minutes.  No blood in the OP at this time.  The packing was removed at 8:30 AM, reevaluated at 8:50 AM and found to have a bleed on the right nasal septum, this was cauterized with silver nitrate by myself.  Reevaluated at 9:18, has posterior bleeding, no anterior bleeding present, posterior packing placed at 9:18 AM.  At 9:50 - pt has no more bleeding, OP clear, nasal passage clear, packing in palced - ENT later this week.  Vida RollerBrian D Kourtnee Lahey, MD 09/18/14 [phone]

## 2014-09-19 ENCOUNTER — Other Ambulatory Visit: Payer: Self-pay | Admitting: *Deleted

## 2014-09-19 MED ORDER — TRAMADOL HCL 50 MG PO TABS: ORAL_TABLET | ORAL | Status: AC

## 2014-09-19 NOTE — Telephone Encounter (Signed)
[facility] 

## 2014-09-23 ENCOUNTER — Emergency Department (HOSPITAL_COMMUNITY)
Admission: EM | Admit: 2014-09-23 | Discharge: 2014-09-23 | Disposition: A | Discharge status: Home / Self Care | Payer: Medicare Other | Attending: Emergency Medicine | Admitting: Emergency Medicine

## 2014-09-23 ENCOUNTER — Encounter (HOSPITAL_COMMUNITY): Payer: Self-pay | Admitting: *Deleted

## 2014-09-23 DIAGNOSIS — R040 Epistaxis: Secondary | ICD-10-CM | POA: Diagnosis not present

## 2014-09-23 DIAGNOSIS — E119 Type 2 diabetes mellitus without complications: Secondary | ICD-10-CM | POA: Insufficient documentation

## 2014-09-23 DIAGNOSIS — I10 Essential (primary) hypertension: Secondary | ICD-10-CM | POA: Diagnosis not present

## 2014-09-23 DIAGNOSIS — Z792 Long term (current) use of antibiotics: Secondary | ICD-10-CM | POA: Insufficient documentation

## 2014-09-23 DIAGNOSIS — Z7982 Long term (current) use of aspirin: Secondary | ICD-10-CM | POA: Insufficient documentation

## 2014-09-23 DIAGNOSIS — Z79899 Other long term (current) drug therapy: Secondary | ICD-10-CM | POA: Insufficient documentation

## 2014-09-23 DIAGNOSIS — Z87891 Personal history of nicotine dependence: Secondary | ICD-10-CM | POA: Diagnosis not present

## 2014-09-23 NOTE — ED Provider Notes (Signed)
CSN: [id number]     Arrival date & time 09/23/14  2004 History   First MD Initiated Contact with Patient 09/23/14 2035     Chief Complaint  Patient presents with  . Epistaxis     (Consider location/radiation/quality/duration/timing/severity/associated sxs/prior Treatment) HPI 81 year old male with past history is below persist ED for recurrent epistaxis. Patient states this is been ongoing intermittently for the last 7 days. He was seen previously in this ED on 09/18/14 where he received labs and was treated for his nosebleed. His hemoglobin was 12.6 and at time in patient's epistaxis resolved after pressure alone. He was set up with ENT follow-up which he has since been to. ENT put in packing to the right near and also cauterized an area of bleeding. He is scheduled for ENT follow-up tomorrow. Patient states since being cauterized and packed he has had mild bleeding as well as bleeding down the back of his throat. He denies any lightheadedness, near syncope, shortness of breath, other symptoms. Patient reports having fractured femur requiring surgery November of last year which he was getting Lovenox shots up until recently which was contributing to his bleeding. Patient is not currently on any anticoagulation. Past Medical History  Diagnosis Date  . Diabetes mellitus without complication   . Hypertension    Past Surgical History  Procedure Laterality Date  . Hip fracture surgery Right 08/01/2014    IM RIGHT HIP  . Femur im nail Right 08/01/2014    Procedure: INTRAMEDULLARY (IM) NAIL FEMORAL;  Surgeon: Cheral Almas, MD;  Location: [facility] OR;  Service: Orthopedics;  Laterality: Right;   Family History  Problem Relation Age of Onset  . Other Neg Hx    History  Substance Use Topics  . Smoking status: Former Games developer  . Smokeless tobacco: Never Used     Comment: QUIT SMOKING IN 1963  . Alcohol Use: No    Review of Systems  Constitutional: Negative for fever, activity change and  appetite change.  HENT: Positive for nosebleeds. Negative for congestion, rhinorrhea and sore throat.   Eyes: Negative for visual disturbance.  Respiratory: Negative for cough and shortness of breath.   Cardiovascular: Negative for chest pain, palpitations and leg swelling.  Gastrointestinal: Negative for nausea, vomiting, abdominal pain and diarrhea.  Genitourinary: Negative for dysuria, flank pain, decreased urine volume and difficulty urinating.  Musculoskeletal: Negative for back pain and neck pain.  Skin: Negative for rash.  Neurological: Negative for dizziness, syncope, speech difficulty, weakness, light-headedness, numbness and headaches.  Psychiatric/Behavioral: Negative for confusion.      Allergies  Peanuts  Home Medications   Prior to Admission medications   Medication Sig Start Date End Date Taking? Authorizing Provider  acetaminophen (TYLENOL) 325 MG tablet Take 2 tablets (650 mg total) by mouth every 6 (six) hours as needed for mild pain (or Fever >/= 101). 08/05/14   Christiane Ha, MD  acetaminophen (TYLENOL) 650 MG CR tablet Take 650 mg by mouth every 6 (six) hours as needed for pain or fever.    Historical Provider, MD  aspirin 325 MG tablet Take 325 mg by mouth 2 (two) times daily.    Historical Provider, MD  atenolol (TENORMIN) 25 MG tablet Take 25 mg by mouth daily.    Historical Provider, MD  bisacodyl (DULCOLAX) 10 MG suppository Place 10 mg rectally daily as needed for moderate constipation.    Historical Provider, MD  cephALEXin (KEFLEX) 500 MG capsule Take 1 capsule (500 mg total) by mouth 2 (two)  times daily. 09/18/14   Vida Roller, MD  enoxaparin (LOVENOX) 40 MG/0.4ML injection Inject 0.4 mLs (40 mg total) into the skin daily. Patient not taking: Reported on 09/18/2014 08/01/14   Cheral Almas, MD  glyBURIDE (DIABETA) 5 MG tablet Take 5 mg by mouth daily.    Historical Provider, MD  magnesium hydroxide (MILK OF MAGNESIA) 400 MG/5ML suspension Take  30 mLs by mouth daily as needed for mild constipation.    Historical Provider, MD  Multiple Vitamin (MULTIVITAMIN WITH MINERALS) TABS tablet Take 1 tablet by mouth daily.    Historical Provider, MD  ondansetron (ZOFRAN) 4 MG tablet Take 1 tablet (4 mg total) by mouth every 6 (six) hours as needed for nausea. 08/05/14   Christiane Ha, MD  oxyCODONE (OXY IR/ROXICODONE) 5 MG immediate release tablet Take 1-3 tablets (5-15 mg total) by mouth every 4 (four) hours as needed. Patient taking differently: Take 5-10 mg by mouth every 4 (four) hours as needed for moderate pain.  08/01/14   Naiping Glee Arvin, MD  polyethylene glycol [facility] / Ethelene Hal) packet Take 17 g by mouth daily.    Historical Provider, MD  senna (SENOKOT) 8.6 MG TABS tablet Take 1 tablet (8.6 mg total) by mouth 2 (two) times daily. 08/05/14   Christiane Ha, MD  traMADol Janean Sark) 50 MG tablet Take one tablet by mouth every 6 hours as needed for pain 09/19/14   [facility], DO   BP 130/59 mmHg  Pulse 78  Temp(Src) 98.6 F (37 C) (Oral)  Resp 20  Ht  (1.702 m)  Wt 120 lb (54.432 kg)  BMI 18.79 kg/m2  SpO2 99% Physical Exam  Constitutional: He is oriented to person, place, and time. He appears well-developed and well-nourished. No distress.  HENT:  Head: Normocephalic and atraumatic.  Nose: Nose normal. No nasal deformity, septal deviation or nasal septal hematoma. No epistaxis.  Mouth/Throat: Oropharynx is clear and moist. No oropharyngeal exudate.  Packing removed. Pt blew out clot from R nares. No signs of active bleeding in R nares. Area previously cauterized visualized and is not bleeding and no signs of surrounding edema. L nares with small dried blood.  Eyes: Conjunctivae and EOM are normal.  Neck: Normal range of motion. Neck supple. No JVD present.  Cardiovascular: Normal rate, regular rhythm, normal heart sounds and intact distal pulses.   Pulmonary/Chest: Effort normal and breath sounds  normal. No respiratory distress.  Abdominal: Soft. He exhibits no distension. There is no tenderness. There is no rebound and no guarding.  Musculoskeletal: Normal range of motion.  Neurological: He is alert and oriented to person, place, and time. No cranial nerve deficit.  Skin: Skin is warm and dry. No rash noted.  Psychiatric: He has a normal mood and affect.    ED Course  Procedures (including critical care time) Labs Review Labs Reviewed - No data to display  Imaging Review No results found.   EKG Interpretation None      MDM   Final diagnoses:  Epistaxis, recurrent   Recurrent epistaxis. HDS in ED. Patient and there was examined with nasal speculum and there is no signs of active bleeding. Patient without any symptoms to warrant repeat labs at this time. Additionally, patient has no tachycardia. Patient stable for discharge and will see ENT tomorrow.   Clinical Impression: 1. Epistaxis, recurrent     Disposition: Discharge  Condition: Good  I have discussed the results, Dx and Tx plan with the pt(&  family if present). He/she/they expressed understanding and agree(s) with the plan. Discharge instructions discussed at great length. Strict return precautions discussed and pt &/or family have verbalized understanding of the instructions. No further questions at time of discharge.    Discharge Medication List as of 09/23/2014 10:15 PM      Follow Up: Billee CashingWayland McKenzie, MD [address] [place] [id number] [phone]   As needed  [facility] [address] [facility] [address] [id number] [place] [id number] [phone]  If symptoms worsen     Please go to your ENT appointment tomorrow as scheduled.   Pt seen in conjunction with Dr. Enos FlingGoldston  Shaden Lacher, DO [facility] Emergency [facility] Resident - PGY-2     Ames DuraStephen Abdelaziz Westenberger, MD 09/23/14 [id number]  Audree CamelScott T Goldston, MD 09/27/14 [id number]

## 2014-09-23 NOTE — ED Notes (Signed)
Pt [facility] c/o nose bleed. Pt does take blood thinners and has a hx with nose bleed. [facility] they normally manage the nose bleed but [facility]. Staff reported that pt picks at his nose frequently.

## 2014-09-23 NOTE — Discharge Instructions (Signed)

## 2014-09-24 ENCOUNTER — Other Ambulatory Visit: Payer: Self-pay | Admitting: Otolaryngology

## 2014-09-24 ENCOUNTER — Encounter (HOSPITAL_BASED_OUTPATIENT_CLINIC_OR_DEPARTMENT_OTHER): Payer: Self-pay | Admitting: *Deleted

## 2014-09-24 NOTE — H&P (Signed)
PREOPERATIVE H&P  Chief Complaint: right sided epistaxis  HPI: [name] is a [age] y.o. male who presents for evaluation of right sided epistaxis that began 1 week ago  . He was seen in the ER and had a balloon packing placed and was seen in my office 5 days ago. When the ballon was removed he had recurrent bleeding for superior and posterior but I could not visualize the site or origin of bleeding in the office and had to pack the superior and posterior nasal cavity. He had recurrent bleeding this past weekend and was seen back in the ER. He returned to my office 1/18 and had some bright red blood in the nose and oropharynx but no active bleeding. He's taken to the OR for removal of the remaining packing and cauterization of the bleeding site with nasal endoscopes.  Past Medical History  Diagnosis Date  . Diabetes mellitus without complication   . Hypertension   . Wears glasses    Past Surgical History  Procedure Laterality Date  . Hip fracture surgery Right 08/01/2014    IM RIGHT HIP  . Femur im nail Right 08/01/2014    Procedure: INTRAMEDULLARY (IM) NAIL FEMORAL;  Surgeon: Cheral AlmasNaiping Michael Xu, MD;  Location: [facility] OR;  Service: Orthopedics;  Laterality: Right;   History   Social History  . Marital Status: Divorced    Spouse Name: N/A    Number of Children: N/A  . Years of Education: N/A   Social History Main Topics  . Smoking status: Former Games developermoker  . Smokeless tobacco: Never Used     Comment: QUIT SMOKING IN 1963  . Alcohol Use: No  . Drug Use: No  . Sexual Activity: None   Other Topics Concern  . None   Social History Narrative   Family History  Problem Relation Age of Onset  . Other Neg Hx    Allergies  Allergen Reactions  . Peanuts [Peanut Oil] Hives   Prior to Admission medications   Medication Sig Start Date End Date Taking? Authorizing Provider  acetaminophen (TYLENOL) 325 MG tablet Take 2 tablets (650 mg total) by mouth every 6 (six) hours as needed for  mild pain (or Fever >/= 101). 08/05/14   Christiane Haorinna L [name], MD  acetaminophen (TYLENOL) 650 MG CR tablet Take 650 mg by mouth every 6 (six) hours as needed for pain or fever.    Historical Provider, MD  aspirin 325 MG tablet Take 325 mg by mouth 2 (two) times daily.    Historical Provider, MD  atenolol (TENORMIN) 25 MG tablet Take 25 mg by mouth daily.    Historical Provider, MD  bisacodyl (DULCOLAX) 10 MG suppository Place 10 mg rectally daily as needed for moderate constipation.    Historical Provider, MD  cephALEXin (KEFLEX) 500 MG capsule Take 1 capsule (500 mg total) by mouth 2 (two) times daily. 09/18/14   Vida RollerBrian D Miller, MD  enoxaparin (LOVENOX) 40 MG/0.4ML injection Inject 0.4 mLs (40 mg total) into the skin daily. Patient not taking: Reported on 09/18/2014 08/01/14   Cheral AlmasNaiping Michael Xu, MD  glyBURIDE (DIABETA) 5 MG tablet Take 5 mg by mouth daily.    Historical Provider, MD  magnesium hydroxide (MILK OF MAGNESIA) 400 MG/5ML suspension Take 30 mLs by mouth daily as needed for mild constipation.    Historical Provider, MD  Multiple Vitamin (MULTIVITAMIN WITH MINERALS) TABS tablet Take 1 tablet by mouth daily.    Historical Provider, MD  ondansetron (ZOFRAN) 4 MG tablet Take  1 tablet (4 mg total) by mouth every 6 (six) hours as needed for nausea. 08/05/14   Christiane Ha, MD  oxyCODONE (OXY IR/ROXICODONE) 5 MG immediate release tablet Take 1-3 tablets (5-15 mg total) by mouth every 4 (four) hours as needed. Patient taking differently: Take 5-10 mg by mouth every 4 (four) hours as needed for moderate pain.  08/01/14   Naiping Glee Arvin, MD  polyethylene glycol [facility] / Ethelene Hal) packet Take 17 g by mouth daily.    Historical Provider, MD  senna (SENOKOT) 8.6 MG TABS tablet Take 1 tablet (8.6 mg total) by mouth 2 (two) times daily. 08/05/14   Christiane Ha, MD  traMADol Janean Sark) 50 MG tablet Take one tablet by mouth every 6 hours as needed for pain 09/19/14   [facility], DO     Positive ROS: right sided epstaxis  All other systems have been reviewed and were otherwise negative with the exception of those mentioned in the HPI and as above.  Physical Exam: There were no vitals filed for this visit.  General: Alert, no acute distress Oral: Normal oral mucosa and tonsils. Bright red blood in oropharynx Nasal: Packing in superior aspect of the right nostril with some dried blood. No active bleeding noted. Neck: No palpable adenopathy or thyroid nodules Ear: Ear canal is clear with normal appearing TMs Cardiovascular: Regular rate and rhythm, no murmur.  Respiratory: Clear to auscultation Neurologic: Alert and oriented x 3   Assessment/Plan: NOSE BLEED Plan for Procedure(s): ENDOSCOPIC CATHERIZATION OF EPISTAXIS    Dillard Cannon, MD 09/24/2014 3:22 PM

## 2014-09-24 NOTE — Progress Notes (Signed)
Will need istat- 

## 2014-09-25 ENCOUNTER — Encounter (HOSPITAL_BASED_OUTPATIENT_CLINIC_OR_DEPARTMENT_OTHER): Payer: Self-pay | Admitting: Anesthesiology

## 2014-09-25 ENCOUNTER — Ambulatory Visit (HOSPITAL_BASED_OUTPATIENT_CLINIC_OR_DEPARTMENT_OTHER): Payer: Medicare Other | Admitting: Anesthesiology

## 2014-09-25 ENCOUNTER — Ambulatory Visit (HOSPITAL_BASED_OUTPATIENT_CLINIC_OR_DEPARTMENT_OTHER)
Admission: RE | Admit: 2014-09-25 | Discharge: 2014-09-25 | Disposition: A | Discharge status: Home / Self Care | Payer: Medicare Other | Source: Ambulatory Visit | Attending: Otolaryngology | Admitting: Otolaryngology

## 2014-09-25 ENCOUNTER — Encounter (HOSPITAL_BASED_OUTPATIENT_CLINIC_OR_DEPARTMENT_OTHER)
Admission: RE | Disposition: A | Discharge status: Home / Self Care | Payer: Self-pay | Source: Ambulatory Visit | Attending: Otolaryngology

## 2014-09-25 DIAGNOSIS — I10 Essential (primary) hypertension: Secondary | ICD-10-CM | POA: Insufficient documentation

## 2014-09-25 DIAGNOSIS — R040 Epistaxis: Secondary | ICD-10-CM | POA: Diagnosis present

## 2014-09-25 DIAGNOSIS — Z7982 Long term (current) use of aspirin: Secondary | ICD-10-CM | POA: Diagnosis not present

## 2014-09-25 DIAGNOSIS — E119 Type 2 diabetes mellitus without complications: Secondary | ICD-10-CM | POA: Diagnosis not present

## 2014-09-25 DIAGNOSIS — Z87891 Personal history of nicotine dependence: Secondary | ICD-10-CM | POA: Insufficient documentation

## 2014-09-25 HISTORY — PX: NASAL ENDOSCOPY WITH EPISTAXIS CONTROL: SHX5664

## 2014-09-25 HISTORY — DX: Presence of spectacles and contact lenses: Z97.3

## 2014-09-25 LAB — POCT I-STAT, CHEM 8
BUN: 20 mg/dL (ref 6–23)
CHLORIDE: 96 meq/L (ref 96–112)
CREATININE: 1 mg/dL (ref 0.50–1.35)
Calcium, Ion: 1.19 mmol/L (ref 1.13–1.30)
Glucose, Bld: 167 mg/dL — ABNORMAL HIGH (ref 70–99)
HCT: 29 % — ABNORMAL LOW (ref 39.0–52.0)
Hemoglobin: 9.9 g/dL — ABNORMAL LOW (ref 13.0–17.0)
Potassium: 4 mmol/L (ref 3.5–5.1)
SODIUM: 137 mmol/L (ref 135–145)
TCO2: 27 mmol/L (ref 0–100)

## 2014-09-25 LAB — GLUCOSE, CAPILLARY: GLUCOSE-CAPILLARY: 135 mg/dL — AB (ref 70–99)

## 2014-09-25 SURGERY — CONTROL OF EPISTAXIS, ENDOSCOPIC
Anesthesia: General | Site: Nose | Laterality: Right

## 2014-09-25 MED ORDER — LIDOCAINE HCL (CARDIAC) 20 MG/ML IV SOLN
INTRAVENOUS | Status: DC | PRN
  Administered 2014-09-25: 50 mg via INTRAVENOUS

## 2014-09-25 MED ORDER — HYDROMORPHONE HCL 1 MG/ML IJ SOLN
INTRAMUSCULAR | Status: AC
  Filled 2014-09-25: qty 1

## 2014-09-25 MED ORDER — FENTANYL CITRATE 0.05 MG/ML IJ SOLN: 50.0000 ug | INTRAMUSCULAR | Status: DC | PRN

## 2014-09-25 MED ORDER — SILVER NITRATE-POT NITRATE 75-25 % EX MISC
CUTANEOUS | Status: AC
  Filled 2014-09-25: qty 1

## 2014-09-25 MED ORDER — FENTANYL CITRATE 0.05 MG/ML IJ SOLN
INTRAMUSCULAR | Status: AC
  Filled 2014-09-25: qty 6

## 2014-09-25 MED ORDER — ONDANSETRON HCL 4 MG/2ML IJ SOLN: 4.0000 mg | Freq: Once | INTRAMUSCULAR | Status: DC | PRN

## 2014-09-25 MED ORDER — OXYCODONE HCL 5 MG PO TABS: 5.0000 mg | ORAL_TABLET | Freq: Once | ORAL | Status: DC | PRN

## 2014-09-25 MED ORDER — OXYMETAZOLINE HCL 0.05 % NA SOLN
NASAL | Status: AC
  Filled 2014-09-25: qty 15

## 2014-09-25 MED ORDER — SUCCINYLCHOLINE CHLORIDE 20 MG/ML IJ SOLN
INTRAMUSCULAR | Status: DC | PRN
  Administered 2014-09-25: 50 mg via INTRAVENOUS

## 2014-09-25 MED ORDER — MIDAZOLAM HCL 2 MG/2ML IJ SOLN
INTRAMUSCULAR | Status: AC
  Filled 2014-09-25: qty 2

## 2014-09-25 MED ORDER — OXYMETAZOLINE HCL 0.05 % NA SOLN
NASAL | Status: DC | PRN
  Administered 2014-09-25: 1 via NASAL

## 2014-09-25 MED ORDER — FENTANYL CITRATE 0.05 MG/ML IJ SOLN
INTRAMUSCULAR | Status: DC | PRN
  Administered 2014-09-25 (×2): 50 ug via INTRAVENOUS

## 2014-09-25 MED ORDER — OXYCODONE HCL 5 MG/5ML PO SOLN: 5.0000 mg | Freq: Once | ORAL | Status: DC | PRN

## 2014-09-25 MED ORDER — SILVER NITRATE-POT NITRATE 75-25 % EX MISC
CUTANEOUS | Status: DC | PRN
  Administered 2014-09-25: 2 via TOPICAL

## 2014-09-25 MED ORDER — LIDOCAINE-EPINEPHRINE 1 %-1:100000 IJ SOLN
INTRAMUSCULAR | Status: AC
  Filled 2014-09-25: qty 1

## 2014-09-25 MED ORDER — PROPOFOL 10 MG/ML IV BOLUS
INTRAVENOUS | Status: AC
  Filled 2014-09-25: qty 80

## 2014-09-25 MED ORDER — PROPOFOL 10 MG/ML IV EMUL
INTRAVENOUS | Status: AC
  Filled 2014-09-25: qty 50

## 2014-09-25 MED ORDER — MIDAZOLAM HCL 2 MG/2ML IJ SOLN: 1.0000 mg | INTRAMUSCULAR | Status: DC | PRN

## 2014-09-25 MED ORDER — LACTATED RINGERS IV SOLN
INTRAVENOUS | Status: DC
  Administered 2014-09-25 (×2): via INTRAVENOUS

## 2014-09-25 MED ORDER — HYDROMORPHONE HCL 1 MG/ML IJ SOLN
0.2500 mg | INTRAMUSCULAR | Status: DC | PRN
  Administered 2014-09-25 (×3): 0.25 mg via INTRAVENOUS

## 2014-09-25 MED ORDER — PROPOFOL 10 MG/ML IV BOLUS
INTRAVENOUS | Status: DC | PRN
  Administered 2014-09-25: 100 mg via INTRAVENOUS

## 2014-09-25 SURGICAL SUPPLY — 40 items
APPLICATOR COTTON TIP 6IN STRL (MISCELLANEOUS) ×3 IMPLANT
CANISTER SUCT 1200ML W/VALVE (MISCELLANEOUS) ×3 IMPLANT
COAGULATOR SUCT 8FR VV (MISCELLANEOUS) ×2 IMPLANT
CONT SPEC 4OZ CLIKSEAL STRL BL (MISCELLANEOUS) IMPLANT
CORDS BIPOLAR (ELECTRODE) IMPLANT
DECANTER SPIKE VIAL GLASS SM (MISCELLANEOUS) IMPLANT
DEPRESSOR TONGUE BLADE STERILE (MISCELLANEOUS) IMPLANT
DRESSING NASAL POPE 10X1.5X2.5 (GAUZE/BANDAGES/DRESSINGS) IMPLANT
DRSG NASAL POPE 10X1.5X2.5 (GAUZE/BANDAGES/DRESSINGS)
DRSG TELFA 3X8 NADH (GAUZE/BANDAGES/DRESSINGS) IMPLANT
ELECT REM PT RETURN 9FT ADLT (ELECTROSURGICAL) ×3
ELECT REM PT RETURN 9FT PED (ELECTROSURGICAL)
ELECTRODE REM PT RETRN 9FT PED (ELECTROSURGICAL) IMPLANT
ELECTRODE REM PT RTRN 9FT ADLT (ELECTROSURGICAL) IMPLANT
GLOVE BIOGEL PI IND STRL 7.0 (GLOVE) IMPLANT
GLOVE BIOGEL PI INDICATOR 7.0 (GLOVE) ×2
GLOVE ECLIPSE 6.5 STRL STRAW (GLOVE) ×2 IMPLANT
GLOVE SS BIOGEL STRL SZ 7.5 (GLOVE) ×1 IMPLANT
GLOVE SUPERSENSE BIOGEL SZ 7.5 (GLOVE) ×2
GOWN STRL REUS W/ TWL LRG LVL3 (GOWN DISPOSABLE) IMPLANT
GOWN STRL REUS W/ TWL XL LVL3 (GOWN DISPOSABLE) IMPLANT
GOWN STRL REUS W/TWL LRG LVL3 (GOWN DISPOSABLE) ×3
GOWN STRL REUS W/TWL XL LVL3 (GOWN DISPOSABLE) ×3
HEMOSTAT SURGICEL 2X14 (HEMOSTASIS) ×2 IMPLANT
MARKER SKIN DUAL TIP RULER LAB (MISCELLANEOUS) IMPLANT
NDL PRECISIONGLIDE 27X1.5 (NEEDLE) IMPLANT
NEEDLE PRECISIONGLIDE 27X1.5 (NEEDLE) IMPLANT
PACK BASIN DAY SURGERY FS (CUSTOM PROCEDURE TRAY) ×2 IMPLANT
PAD DRESSING TELFA 3X8 NADH (GAUZE/BANDAGES/DRESSINGS) IMPLANT
PATTIES SURGICAL .5 X3 (DISPOSABLE) ×2 IMPLANT
SHEET MEDIUM DRAPE 40X70 STRL (DRAPES) ×3 IMPLANT
SOLUTION BUTLER CLEAR DIP (MISCELLANEOUS) ×2 IMPLANT
SPONGE GAUZE 2X2 8PLY STER LF (GAUZE/BANDAGES/DRESSINGS)
SPONGE GAUZE 2X2 8PLY STRL LF (GAUZE/BANDAGES/DRESSINGS) IMPLANT
SPONGE GAUZE 4X4 12PLY STER LF (GAUZE/BANDAGES/DRESSINGS) ×3 IMPLANT
SYR CONTROL 10ML LL (SYRINGE) IMPLANT
TOWEL OR 17X24 6PK STRL BLUE (TOWEL DISPOSABLE) ×3 IMPLANT
TUBE CONNECTING 20'X1/4 (TUBING) ×1
TUBE CONNECTING 20X1/4 (TUBING) ×2 IMPLANT
YANKAUER SUCT BULB TIP NO VENT (SUCTIONS) ×3 IMPLANT

## 2014-09-25 NOTE — Anesthesia Preprocedure Evaluation (Signed)
Anesthesia Evaluation  Patient identified by MRN, date of birth, ID band Patient awake    Reviewed: Allergy & Precautions, NPO status , Patient's Chart, lab work & pertinent test results, reviewed documented beta blocker date and time   Airway Mallampati: I  TM Distance: >3 FB Neck ROM: Full    Dental  (+) Poor Dentition, Dental Advisory Given   Pulmonary former smoker,  breath sounds clear to auscultation        Cardiovascular hypertension, Pt. on medications and Pt. on home beta blockers Rhythm:Regular Rate:Normal     Neuro/Psych    GI/Hepatic   Endo/Other  diabetes, Well Controlled, Type 2, Oral Hypoglycemic Agents  Renal/GU      Musculoskeletal   Abdominal   Peds  Hematology   Anesthesia Other Findings   Reproductive/Obstetrics                             Anesthesia Physical Anesthesia Plan  ASA: III  Anesthesia Plan: General   Post-op Pain Management:    Induction: Intravenous  Airway Management Planned: Oral ETT  Additional Equipment:   Intra-op Plan:   Post-operative Plan: Extubation in OR  Informed Consent: I have reviewed the patients History and Physical, chart, labs and discussed the procedure including the risks, benefits and alternatives for the proposed anesthesia with the patient or authorized representative who has indicated his/her understanding and acceptance.   Dental advisory given  Plan Discussed with: CRNA, Anesthesiologist and Surgeon  Anesthesia Plan Comments:         Anesthesia Quick Evaluation

## 2014-09-25 NOTE — Interval H&P Note (Signed)
History and Physical Interval Note:  09/25/2014 9:07 AM  Jesse AndersonSolomon Schnitzer  has presented today for surgery, with the diagnosis of NOSE BLEED  The various methods of treatment have been discussed with the patient and family. After consideration of risks, benefits and other options for treatment, the patient has consented to  Procedure(s): ENDOSCOPIC CAUTERIZATION OF EPISTAXIS  (N/A) as a surgical intervention .  The patient's history has been reviewed, patient examined, no change in status, stable for surgery.  I have reviewed the patient's chart and labs.  Questions were answered to the patient's satisfaction.     Abella Shugart

## 2014-09-25 NOTE — Transfer of Care (Signed)
Immediate Anesthesia Transfer of Care Note  Patient: Jesse AndersonSolomon Mathis  Procedure(s) Performed: Procedure(s): ENDOSCOPIC CAUTERIZATION OF EPISTAXIS  (Right)  Patient Location: PACU  Anesthesia Type:General  Level of Consciousness: sedated  Airway & Oxygen Therapy: Patient Spontanous Breathing and Patient connected to face mask oxygen  Post-op Assessment: Report given to PACU RN and Post -op Vital signs reviewed and stable  Post vital signs: Reviewed and stable  Complications: No apparent anesthesia complications

## 2014-09-25 NOTE — Brief Op Note (Signed)
09/25/2014  10:08 AM  PATIENT:  Cherly AndersonSolomon Peets  81 y.o. male  PRE-OPERATIVE DIAGNOSIS:  NOSE BLEED  POST-OPERATIVE DIAGNOSIS:  NOSE BLEED  PROCEDURE:  Procedure(s): ENDOSCOPIC CAUTERIZATION OF EPISTAXIS  (Right)  SURGEON:  Surgeon(s) and Role:    * Drema Halonhristopher E Lindsea Olivar, MD - Primary  PHYSICIAN ASSISTANT:   ASSISTANTS: none   ANESTHESIA:   general  EBL:  Total I/O In: 1000 [I.V.:1000] Out: -   BLOOD ADMINISTERED:none  DRAINS: none   LOCAL MEDICATIONS USED:  NONE  SPECIMEN:  No Specimen  DISPOSITION OF SPECIMEN:  N/A  COUNTS:  YES  TOURNIQUET:  * No tourniquets in log *  DICTATION: .Other Dictation: Dictation Number [id number]  PLAN OF CARE: Discharge to home after PACU  PATIENT DISPOSITION:  PACU - hemodynamically stable.   Delay start of Pharmacological VTE agent (>24hrs) due to surgical blood loss or risk of bleeding: not applicable

## 2014-09-25 NOTE — Anesthesia Procedure Notes (Signed)
Procedure Name: Intubation Date/Time: 09/25/2014 9:23 AM Performed by: Devine DesanctisLINKA, [name] Pre-anesthesia Checklist: Patient identified, Emergency Drugs available, Suction available, Patient being monitored and Timeout performed Patient Re-evaluated:Patient Re-evaluated prior to inductionOxygen Delivery Method: Circle System Utilized Preoxygenation: Pre-oxygenation with 100% oxygen Intubation Type: IV induction Ventilation: Mask ventilation without difficulty Laryngoscope Size: Miller and 3 Grade View: Grade II Tube type: Oral Tube size: 8.0 mm Number of attempts: 1 Airway Equipment and Method: Stylet and Oral airway Placement Confirmation: ETT inserted through vocal cords under direct vision,  positive ETCO2 and breath sounds checked- equal and bilateral Tube secured with: Tape Dental Injury: Teeth and Oropharynx as per pre-operative assessment

## 2014-09-25 NOTE — Anesthesia Postprocedure Evaluation (Signed)
  Anesthesia Post-op Note  Patient: [name]  Procedure(s) Performed: Procedure(s): ENDOSCOPIC CAUTERIZATION OF EPISTAXIS  (Right)  Patient Location: PACU  Anesthesia Type: General   Level of Consciousness: awake, alert  and oriented  Airway and Oxygen Therapy: Patient Spontanous Breathing  Post-op Pain: mild  Post-op Assessment: Post-op Vital signs reviewed  Post-op Vital Signs: Reviewed  Last Vitals:  Filed Vitals:   09/25/14 1110  BP:   Pulse: 92  Temp:   Resp: 16    Complications: No apparent anesthesia complications

## 2014-09-25 NOTE — Discharge Instructions (Signed)
° ° ° °  Post Anesthesia Home Care Instructions  Activity: Get plenty of rest for the remainder of the day. A responsible adult should stay with you for 24 hours following the procedure.  For the next 24 hours, DO NOT: -Drive a car -Advertising copywriterperate machinery -Drink alcoholic beverages -Take any medication unless instructed by your physician -Make any legal decisions or sign important papers.  Meals: Start with liquid foods such as gelatin or soup. Progress to regular foods as tolerated. Avoid greasy, spicy, heavy foods. If nausea and/or vomiting occur, drink only clear liquids until the nausea and/or vomiting subsides. Call your physician if vomiting continues.  Special Instructions/Symptoms: Your throat may feel dry or sore from the anesthesia or the breathing tube placed in your throat during surgery. If this causes discomfort, gargle with warm salt water. The discomfort should disappear within 24 hours.   Call your surgeon if you experience:   1.  Fever over 101.0. 2.  Inability to urinate. 3.  Nausea and/or vomiting. 4.  Extreme swelling or bruising at the surgical site. 5.  Continued bleeding from the incision. 6.  Increased pain, redness or drainage from the incision. 7.  Problems related to your pain medication. 8. Any change in color, movement and/or sensation 9. Any problems and/or concerns  Do not blow nose for the next 2 days Call Dr Ezzard Standingnewman if you have any further bleeding   [phone]

## 2014-09-26 ENCOUNTER — Encounter (HOSPITAL_BASED_OUTPATIENT_CLINIC_OR_DEPARTMENT_OTHER): Payer: Self-pay | Admitting: Otolaryngology

## 2014-09-26 NOTE — Op Note (Signed)
[name]:  [name], [name]              ACCOUNT NO.:  [id number]  MEDICAL RECORD NO.:  [id number]  LOCATION:                                FACILITY:  [facility]  PHYSICIAN:  Kristine GarbeChristopher E. Ezzard StandingNewman, M.D.DATE OF BIRTH:  1924-08-24  DATE OF PROCEDURE:  09/25/2014 DATE OF DISCHARGE:  09/25/2014                              OPERATIVE REPORT   PREOPERATIVE DIAGNOSIS:  Recurrent right-sided epistaxis, posterior superior.  POSTOPERATIVE DIAGNOSIS:  Recurrent right-sided epistaxis, posterior superior.  OPERATION:  Endoscopic cauterization of right-sided epistaxis.  SURGEON:  Kristine GarbeChristopher E. Ezzard StandingNewman, M.D.  ANESTHESIA:  General endotracheal.  COMPLICATIONS:  None.  BRIEF CLINICAL NOTE:  [name] is an 81 year old gentleman who resides in an extended care facility.  He developed a right-sided nosebleed on August 19, 2015, was seen in the emergency room, and subsequently was referred to my office for followup.  He had a balloon packing placed in his right nostril on followup in my office.  After removing the balloon packing from his right nostril, [name] had recurrent bright red blood arising from the superior posterior aspect of the right nostril.  Because of a septal deviation to the right, it was difficult to determine the exact origin of bleeding.  Silver nitrate cauterization was attempted, but was not successful and the nose was repacked.  He had some further episodes of bleeding since the packing was placed on September 19, 2014, prompting one more visit to the emergency room over the weekend.  He was seen in my office in followup on Monday, September 24, 2014, and still had small amounts of bleeding that morning and because of recurrent persistent bleeding and difficulty adequately visualizing the site of origin of the bleeding, the patient was taken to the operating room at this time for endoscopic cauterization of right-sided epistaxis.  His initial hemoglobin was about 12 and his  hemoglobin this morning was 9.9.  DESCRIPTION OF PROCEDURE:  The patient underwent general endotracheal anesthesia.  Some of the remaining packing was removed from the right nostril.  He had some small areas of oozing where he had previous cauterization as well as little bit of granulation tissue.  The deviated septum was pushed more to the left side in order to adequately visualize the superior septal portion on the right side as well as to evaluate the middle turbinate, the more posterior area.  Suction cautery and silver nitrate was used to cauterize the small bleeding areas.  The 0 and 30- degree endoscope was used to evaluate the posterior nasal cavity.  I really could not identify any definite site of origin of his nosebleed more than the small areas that were oozing.  There were no intranasal masses or abnormalities noted.  Middle meatus was clear.  Posterior nasal cavity and sphenoid area was clear.  The nasopharynx was clear with no abnormality noted in the nasopharynx.  After cauterization of the septum, a little bit of cauterization of the middle turbinate, the nose was packed with Surgicel on the superior right side only. Following packing of Surgicel, the nose was re-examined with the endoscopes.  Everything was dry.  This completed the procedure.  [name] was awoken by Anesthesia and transferred to  the recovery room postop doing well.  DISPOSITION:  Seven was instructed not to blow his nose for the next 3 days and he is referred back to the extended care facility.  We will have him follow up in my office if he has any further bleeding.          ______________________________ Kristine Garbe. Ezzard Standing, M.D.     CEN/MEDQ  D:  09/25/2014  T:  09/25/2014  Job:  [id number]  cc:   Kristine Garbe. Ezzard Standing, M.D.

## 2014-10-16 ENCOUNTER — Non-Acute Institutional Stay (SKILLED_NURSING_FACILITY): Payer: Medicare Other | Admitting: Nurse Practitioner

## 2014-10-16 DIAGNOSIS — S72001D Fracture of unspecified part of neck of right femur, subsequent encounter for closed fracture with routine healing: Secondary | ICD-10-CM | POA: Diagnosis not present

## 2014-10-16 DIAGNOSIS — D649 Anemia, unspecified: Secondary | ICD-10-CM

## 2014-10-16 DIAGNOSIS — E119 Type 2 diabetes mellitus without complications: Secondary | ICD-10-CM | POA: Diagnosis not present

## 2014-10-16 DIAGNOSIS — K5901 Slow transit constipation: Secondary | ICD-10-CM

## 2014-10-16 DIAGNOSIS — R040 Epistaxis: Secondary | ICD-10-CM | POA: Diagnosis not present

## 2014-10-16 DIAGNOSIS — I10 Essential (primary) hypertension: Secondary | ICD-10-CM

## 2014-10-16 DIAGNOSIS — E43 Unspecified severe protein-calorie malnutrition: Secondary | ICD-10-CM

## 2014-10-16 NOTE — Progress Notes (Signed)
Patient ID: [name], male   DOB: 07-09-1924, 81 y.o.   MRN: [id number]    Nursing Home Location:  [facility]   Place of Service: SNF (31)  PCP: Billee Cashing, MD  Allergies  Allergen Reactions  . Peanuts [Peanut Oil] Hives    Chief Complaint  Patient presents with  . Medical Management of Chronic Issues    HPI:  Patient is a 81 y.o. male seen today at [facility] for routine follow up on chronic conditions. Pt has been at [facility] for rehab after hip surgery from right hip fracture. Pt developed right heel ulcer that has been followed weekly by wound care. Pt reports therapy has been very limited due to heel ulcer. pts major complaint is constipation. Currently taking miralax daily, senna s twice daily, and enema as needed and still struggles with constipation. Otherwise pt reports he is doing well. Reports good appetite and eating more of the food provided (different from home diet so he is trying to adjust) so his heel wound will heal.    Review of Systems:  Review of Systems  Constitutional: Negative for activity change, appetite change, fatigue and unexpected weight change.  HENT: Negative for congestion, facial swelling, hearing loss, mouth sores, nosebleeds, postnasal drip, rhinorrhea, sinus pressure, sneezing, sore throat and tinnitus.   Eyes: Negative.   Respiratory: Negative for cough and shortness of breath.   Cardiovascular: Negative for chest pain, palpitations and leg swelling.  Gastrointestinal: Positive for constipation. Negative for abdominal pain and diarrhea.  Genitourinary: Negative for dysuria and difficulty urinating.  Musculoskeletal: Negative for myalgias and arthralgias.  Skin: Positive for wound (left heel). Negative for color change.  Neurological: Negative for dizziness and weakness.  Psychiatric/Behavioral: Negative for behavioral problems, confusion and agitation.    Past Medical History  Diagnosis Date  .  Diabetes mellitus without complication   . Hypertension   . Wears glasses    Past Surgical History  Procedure Laterality Date  . Hip fracture surgery Right 08/01/2014    IM RIGHT HIP  . Femur im nail Right 08/01/2014    Procedure: INTRAMEDULLARY (IM) NAIL FEMORAL;  Surgeon: Cheral Almas, MD;  Location: [facility];  Service: Orthopedics;  Laterality: Right;  . Nasal endoscopy with epistaxis control Right 09/25/2014    Procedure: ENDOSCOPIC CAUTERIZATION OF EPISTAXIS ;  Surgeon: Drema Halon, MD;  Location: [facility];  Service: ENT;  Laterality: Right;   Social History:   reports that he has quit smoking. He has never used smokeless tobacco. He reports that he does not drink alcohol or use illicit drugs.  Family History  Problem Relation Age of Onset  . Other Neg Hx     Medications: Patient's Medications  New Prescriptions   No medications on file  Previous Medications   ACETAMINOPHEN (TYLENOL) 650 MG CR TABLET    Take 650 mg by mouth every 6 (six) hours as needed for pain or fever.   ASPIRIN 325 MG TABLET    Take 325 mg by mouth 2 (two) times daily.   ATENOLOL (TENORMIN) 25 MG TABLET    Take 25 mg by mouth daily.   BISACODYL (DULCOLAX) 10 MG SUPPOSITORY    Place 10 mg rectally daily as needed for moderate constipation.   GLYBURIDE (DIABETA) 5 MG TABLET    Take 5 mg by mouth daily.   MAGNESIUM HYDROXIDE (MILK OF MAGNESIA) 400 MG/5ML SUSPENSION    Take 30 mLs by mouth daily as  needed for mild constipation.   MULTIPLE VITAMIN (MULTIVITAMIN WITH MINERALS) TABS TABLET    Take 1 tablet by mouth daily.   ONDANSETRON (ZOFRAN) 4 MG TABLET    Take 1 tablet (4 mg total) by mouth every 6 (six) hours as needed for nausea.   OXYCODONE (OXY IR/ROXICODONE) 5 MG IMMEDIATE RELEASE TABLET    Take 1-3 tablets (5-15 mg total) by mouth every 4 (four) hours as needed.   POLYETHYLENE GLYCOL (MIRALAX / GLYCOLAX) PACKET    Take 17 g by mouth daily.   SENNA (SENOKOT) 8.6 MG TABS  TABLET    Take 1 tablet (8.6 mg total) by mouth 2 (two) times daily.   TRAMADOL (ULTRAM) 50 MG TABLET    Take one tablet by mouth every 6 hours as needed for pain  Modified Medications   No medications on file  Discontinued Medications   CEPHALEXIN (KEFLEX) 500 MG CAPSULE    Take 1 capsule (500 mg total) by mouth 2 (two) times daily.     Physical Exam: Filed Vitals:   10/16/14 2109  BP: 149/62  Pulse: 78  Temp: 97.8 F (36.6 C)  Resp: 20  Weight: 122 lb (55.339 kg)    Physical Exam  Constitutional: He is oriented to person, place, and time. No distress.  HENT:  Head: Normocephalic and atraumatic.  Right Ear: Tympanic membrane, external ear and ear canal normal.  Left Ear: Tympanic membrane, external ear and ear canal normal.  Mouth/Throat: Oropharynx is clear and moist. No oropharyngeal exudate.  Eyes: Conjunctivae and EOM are normal. Pupils are equal, round, and reactive to light.  Neck: Normal range of motion. Neck supple.  Cardiovascular: Normal rate, regular rhythm and normal heart sounds.   Pulmonary/Chest: Effort normal and breath sounds normal.  Abdominal: Soft. Bowel sounds are normal.  Neurological: He is alert and oriented to person, place, and time.  Skin: Skin is warm and dry. He is not diaphoretic.  Psychiatric: He has a normal mood and affect.    Labs reviewed: Basic Metabolic Panel:  Recent Labs  16/06/9610/26/15 0403 08/03/14 0347 08/05/14 0330 09/25/14 0822  NA 135* 131* 131* 137  K 3.9 3.4* 3.6* 4.0  CL 96 91* 90* 96  CO2 26 30 27   --   GLUCOSE 139* 128* 95 167*  BUN 19 15 18 20   CREATININE 1.25 1.29 1.25 1.00  CALCIUM 8.2* 8.6 8.6  --    Liver Function Tests:  Recent Labs  08/01/14 0034 08/01/14 0531  AST 36 32  ALT 28 27  ALKPHOS 144* 127*  BILITOT 0.4 0.4  PROT 7.6 7.0  ALBUMIN 3.8 3.5   No results for input(s): LIPASE, AMYLASE in the last 8760 hours. No results for input(s): AMMONIA in the last 8760 hours. CBC:  Recent Labs   08/01/14 0034 08/01/14 0531  08/04/14 0351 08/05/14 0330 09/18/14 0606 09/25/14 0822  WBC 17.2* 17.8*  < > 13.2* 9.5 10.7*  --   NEUTROABS 15.1* 16.0*  --   --   --   --   --   HGB 15.3 13.4  < > 6.9* 7.7* 12.6* 9.9*  HCT 44.6 40.4  < > 19.5* 22.4* 38.8* 29.0*  MCV 91.6 92.0  < > 87.4 85.5 95.6  --   PLT 205 218  < > 154 182 244  --   < > = values in this interval not displayed. TSH: No results for input(s): TSH in the last 8760 hours. A1C: No results found for: HGBA1C  Lipid Panel: No results for input(s): CHOL, HDL, LDLCALC, TRIG, CHOLHDL, LDLDIRECT in the last 8760 hours.    Assessment/Plan  1. Essential hypertension Stable on current regimen   2. Diabetes mellitus type 2, controlled Will get A1c  3. Hip fracture requiring operative repair, right, closed, with routine healing, subsequent encounter Doing well with hip postop, pain minimal at this time  4. Postoperative anemia After hip surgery and also with recent epistaxis needing to be cauterized in the OR, will follow up CBC  5. Slow transit constipation Will start amitiza 24 mcg PO BID, cont senna for 5 more days then stop, will change miralax to PRN for better bowel control  6. Protein calorie malnutrition At home ate minimally, pt reports he ate mostly fish and veggies at home. Now has increased his food choices while at [facility], weight has been stable, will follow up CMP  7. Epistaxis  No additional bleeding since cauterized

## 2014-10-18 ENCOUNTER — Non-Acute Institutional Stay (SKILLED_NURSING_FACILITY): Payer: Medicare Other | Admitting: Internal Medicine

## 2014-10-18 DIAGNOSIS — F0390 Unspecified dementia without behavioral disturbance: Secondary | ICD-10-CM

## 2014-10-18 DIAGNOSIS — Z7189 Other specified counseling: Secondary | ICD-10-CM | POA: Diagnosis not present

## 2014-10-18 NOTE — Progress Notes (Signed)
MRN: [id number] Name: [name]  Sex: male Age: 81 y.o. DOB: [birth year]  PSC #: Sonny Dandy Facility/Room:211 Level Of Care: SNF Provider: Merrilee Seashore D Emergency Contacts: Extended Emergency Contact Information Primary Emergency Contact: Kelley,[name]  [place] Home Phone: [phone] Relation: Daughter Secondary Emergency Contact: Lenier,[name] Address: [address] [place] [address] [place] Home Phone: [phone] Relation: Nephew    Allergies: Peanuts  Chief Complaint  Patient presents with  . Acute Visit    HPI: Patient is 81 y.o. male who is being seen to discuss if he is ready to be discharged to home.  Past Medical History  Diagnosis Date  . Diabetes mellitus without complication   . Hypertension   . Wears glasses     Past Surgical History  Procedure Laterality Date  . Hip fracture surgery Right 08/01/2014    IM RIGHT HIP  . Femur im nail Right 08/01/2014    Procedure: INTRAMEDULLARY (IM) NAIL FEMORAL;  Surgeon: Cheral Almas, MD;  Location: [facility] OR;  Service: Orthopedics;  Laterality: Right;  . Nasal endoscopy with epistaxis control Right 09/25/2014    Procedure: ENDOSCOPIC CAUTERIZATION OF EPISTAXIS ;  Surgeon: Drema Halon, MD;  Location: [facility];  Service: ENT;  Laterality: Right;      Medication List       This list is accurate as of: 10/18/14 11:59 PM.  Always use your most recent med list.               acetaminophen 650 MG CR tablet  Commonly known as:  TYLENOL  Take 650 mg by mouth every 6 (six) hours as needed for pain or fever.     aspirin 325 MG tablet  Take 325 mg by mouth 2 (two) times daily.     atenolol 25 MG tablet  Commonly known as:  TENORMIN  Take 25 mg by mouth daily.     bisacodyl 10 MG suppository  Commonly known as:  DULCOLAX  Place 10 mg rectally daily as needed for moderate constipation.     glyBURIDE 5 MG tablet  Commonly  known as:  DIABETA  Take 5 mg by mouth daily.     magnesium hydroxide 400 MG/5ML suspension  Commonly known as:  MILK OF MAGNESIA  Take 30 mLs by mouth daily as needed for mild constipation.     multivitamin with minerals Tabs tablet  Take 1 tablet by mouth daily.     ondansetron 4 MG tablet  Commonly known as:  ZOFRAN  Take 1 tablet (4 mg total) by mouth every 6 (six) hours as needed for nausea.     oxyCODONE 5 MG immediate release tablet  Commonly known as:  Oxy IR/ROXICODONE  Take 1-3 tablets (5-15 mg total) by mouth every 4 (four) hours as needed.     polyethylene glycol packet  Commonly known as:  MIRALAX / GLYCOLAX  Take 17 g by mouth daily.     senna 8.6 MG Tabs tablet  Commonly known as:  SENOKOT  Take 1 tablet (8.6 mg total) by mouth 2 (two) times daily.     traMADol 50 MG tablet  Commonly known as:  ULTRAM  Take one tablet by mouth every 6 hours as needed for pain        No orders of the defined types were placed in this encounter.     There is no immunization history on file for  this patient.  History  Substance Use Topics  . Smoking status: Former Games developer  . Smokeless tobacco: Never Used     Comment: QUIT SMOKING IN 1963  . Alcohol Use: No    Review of Systems  DATA OBTAINED: from patient, nurse, medical record, family member GENERAL:  no fevers, fatigue, appetite changes SKIN: No itching, rash HEENT: No complaint RESPIRATORY: No cough, wheezing, SOB CARDIAC: No chest pain, palpitations, lower extremity edema  GI: No abdominal pain, No N/V/D or constipation, No heartburn or reflux  GU: No dysuria, frequency or urgency, or incontinence  MUSCULOSKELETAL: No unrelieved bone/joint pain NEUROLOGIC: No headache, dizziness  PSYCHIATRIC: No overt anxiety or sadness  Filed Vitals:   10/18/14 1952  BP: 128/62  Pulse: 74  Temp: 97.3 F (36.3 C)  Resp: 20    Physical Exam  GENERAL APPEARANCE: Alert, conversant, No acute distress  SKIN: No  diaphoresis rash, HEENT: Unremarkable RESPIRATORY: Breathing is even, unlabored. Lung sounds are clear   CARDIOVASCULAR: Heart RRR no murmurs, rubs or gallops. No peripheral edema  GASTROINTESTINAL: Abdomen is soft, non-tender, not distended w/ normal bowel sounds.  GENITOURINARY: Bladder non tender, not distended  MUSCULOSKELETAL: No abnormal joints or musculature NEUROLOGIC: Cranial nerves 2-12 grossly intact. Moves all extremities PSYCHIATRIC: dementia, no behavioral issues  Patient Active Problem List   Diagnosis Date Noted  . Dementia without behavioral disturbance 10/20/2014  . Protein-calorie malnutrition, severe 08/02/2014  . UTI (urinary tract infection) 08/02/2014  . Postoperative anemia 08/02/2014  . Hip fracture requiring operative repair 08/01/2014  . Hip fracture 08/01/2014  . Diabetes mellitus type 2, controlled 08/01/2014  . Essential hypertension 08/01/2014    CBC    Component Value Date/Time   WBC 10.7* 09/18/2014 0606   RBC 4.06* 09/18/2014 0606   HGB 9.9* 09/25/2014 0822   HCT 29.0* 09/25/2014 0822   PLT 244 09/18/2014 0606   MCV 95.6 09/18/2014 0606   LYMPHSABS 1.1 08/01/2014 0531   MONOABS 0.7 08/01/2014 0531   EOSABS 0.0 08/01/2014 0531   BASOSABS 0.0 08/01/2014 0531    CMP     Component Value Date/Time   NA 137 09/25/2014 0822   K 4.0 09/25/2014 0822   CL 96 09/25/2014 0822   CO2 27 08/05/2014 0330   GLUCOSE 167* 09/25/2014 0822   BUN 20 09/25/2014 0822   CREATININE 1.00 09/25/2014 0822   CALCIUM 8.6 08/05/2014 0330   PROT 7.0 08/01/2014 0531   ALBUMIN 3.5 08/01/2014 0531   AST 32 08/01/2014 0531   ALT 27 08/01/2014 0531   ALKPHOS 127* 08/01/2014 0531   BILITOT 0.4 08/01/2014 0531   GFRNONAA 50* 08/05/2014 0330   GFRAA 57* 08/05/2014 0330    Assessment and Plan  FAMILY CONFERENCE WITH PT PRESENT - in preparation for this meeting I spoke to PT and to wound care as well as complete survey of chart. Pt wants to go home alone with a  neighbor dropping in to help and take him to the store,etc. Per PT pt has not walked in a week, he has been afraid after nosebleed and has not wanted to try. He is in SNF because he fell at home and broke his hip. He is definitely NOT ready to go. Per wound care nurse he has an unstageble wound on his heal that needs care every day 7/7. Per wound care note, there was sharp debridement and wound is better but still significant. Pt's family is in [place] this was a 3 way call with Cordelia Pen  speaking also. After I spoke to family pt had changed his stand and said of course he won't go home without someone 24/7.   Dementia without behavioral disturbance I don't think famly is aware of how much dementia pt has. He is good at covering it up.    Time spent > 35 min with records, PT nursing,pt , family Margit HanksALEXANDER, Terrick Allred D, MD

## 2014-10-20 ENCOUNTER — Encounter: Payer: Self-pay | Admitting: Internal Medicine

## 2014-10-20 DIAGNOSIS — F0390 Unspecified dementia without behavioral disturbance: Secondary | ICD-10-CM | POA: Insufficient documentation

## 2014-10-20 NOTE — Assessment & Plan Note (Signed)
I don't think famly is aware of how much dementia pt has. He is good at covering it up.

## 2014-10-31 ENCOUNTER — Other Ambulatory Visit: Payer: Self-pay | Admitting: *Deleted

## 2014-10-31 MED ORDER — OXYCODONE HCL 5 MG PO TABS: ORAL_TABLET | ORAL | Status: AC

## 2014-11-09 ENCOUNTER — Non-Acute Institutional Stay (SKILLED_NURSING_FACILITY): Payer: Medicare Other | Admitting: Nurse Practitioner

## 2014-11-09 DIAGNOSIS — S91301D Unspecified open wound, right foot, subsequent encounter: Secondary | ICD-10-CM

## 2014-11-09 DIAGNOSIS — K5900 Constipation, unspecified: Secondary | ICD-10-CM | POA: Insufficient documentation

## 2014-11-09 DIAGNOSIS — I10 Essential (primary) hypertension: Secondary | ICD-10-CM | POA: Diagnosis not present

## 2014-11-09 DIAGNOSIS — D649 Anemia, unspecified: Secondary | ICD-10-CM | POA: Diagnosis not present

## 2014-11-09 DIAGNOSIS — E119 Type 2 diabetes mellitus without complications: Secondary | ICD-10-CM | POA: Diagnosis not present

## 2014-11-09 DIAGNOSIS — E43 Unspecified severe protein-calorie malnutrition: Secondary | ICD-10-CM

## 2014-11-09 NOTE — Progress Notes (Signed)
Patient ID: [name], male   DOB: [birth year], 81 y.o.   MRN: [id number]    Nursing Home Location:  [place] of Service: SNF (40)  PCP: [name], MD  Allergies  Allergen Reactions  . Peanuts [Peanut [place]    Chief Complaint  Patient presents with  . Medical Management of Chronic Issues    HPI:  Patient is a 81 y.o. male seen today at [facility] for routine follow up on chronic conditions. Pt conts to be followed by wound care for slow healing right heel wound. In the last month he wanted to go home but due to the increase medical needs due to his wound he decided to stay at [facility]. No acute issues at this time.    Review of Systems:  Review of Systems  Constitutional: Negative for activity change, appetite change, fatigue and unexpected weight change.  HENT: Negative for congestion.   Eyes: Negative.   Respiratory: Negative for cough and shortness of breath.   Cardiovascular: Negative for chest pain, palpitations and leg swelling.  Gastrointestinal: Positive for constipation (has improved). Negative for abdominal pain and diarrhea.  Genitourinary: Negative for dysuria and difficulty urinating.  Musculoskeletal: Negative for myalgias and arthralgias.  Skin: Positive for wound (*right heel wound). Negative for color change.  Neurological: Negative for dizziness and weakness.  Psychiatric/Behavioral: Negative for behavioral problems, confusion and agitation.    Past Medical History  Diagnosis Date  . Diabetes mellitus without complication   . Hypertension   . Wears glasses    Past Surgical History  Procedure Laterality Date  . Hip fracture surgery Right 08/01/2014    IM RIGHT HIP  . Femur im nail Right 08/01/2014    Procedure: INTRAMEDULLARY (IM) NAIL FEMORAL;  Surgeon: Marianna Payment, MD;  Location: [place];  Service: Orthopedics;  Laterality: Right;  . Nasal endoscopy with epistaxis control Right  09/25/2014    Procedure: ENDOSCOPIC CAUTERIZATION OF EPISTAXIS ;  Surgeon: Rozetta Nunnery, MD;  Location: [place];  Service: ENT;  Laterality: Right;   Social History:   reports that he has quit smoking. He has never used smokeless tobacco. He reports that he does not drink alcohol or use illicit drugs.  Family History  Problem Relation Age of Onset  . Other Neg Hx     Medications: Patient's Medications  New Prescriptions   No medications on file  Previous Medications   ACETAMINOPHEN (TYLENOL) 650 MG CR TABLET    Take 650 mg by mouth every 6 (six) hours as needed for pain or fever.   ASPIRIN 325 MG TABLET    Take 325 mg by mouth 2 (two) times daily.   ATENOLOL (TENORMIN) 25 MG TABLET    Take 25 mg by mouth daily.   BISACODYL (DULCOLAX) 10 MG SUPPOSITORY    Place 10 mg rectally daily as needed for moderate constipation.   GLYBURIDE (DIABETA) 5 MG TABLET    Take 5 mg by mouth daily.   LUBIPROSTONE (AMITIZA) 24 MCG CAPSULE    Take 24 mcg by mouth 2 (two) times daily with a meal.   MAGNESIUM HYDROXIDE (MILK OF MAGNESIA) 400 MG/5ML SUSPENSION    Take 30 mLs by mouth daily as needed for mild constipation.   MULTIPLE VITAMIN (MULTIVITAMIN WITH MINERALS) TABS TABLET    Take 1 tablet by mouth daily.   ONDANSETRON (ZOFRAN) 4 MG TABLET    Take 1 tablet (4 mg total) by  mouth every 6 (six) hours as needed for nausea.   OXYCODONE (OXY IR/ROXICODONE) 5 MG IMMEDIATE RELEASE TABLET    Take 1-2 tablets by mouth every 4 hours as needed for moderate to severe pain.   POLYETHYLENE GLYCOL (MIRALAX / GLYCOLAX) PACKET    Take 17 g by mouth daily.   SENNA (SENOKOT) 8.6 MG TABS TABLET    Take 1 tablet (8.6 mg total) by mouth 2 (two) times daily.   TRAMADOL (ULTRAM) 50 MG TABLET    Take one tablet by mouth every 6 hours as needed for pain  Modified Medications   No medications on file  Discontinued Medications   No medications on file     Physical Exam: Filed Vitals:   11/09/14 1515   BP: 136/72  Pulse: 70  Temp: 97.6 F (36.4 C)  Resp: 20    Physical Exam  Constitutional: He is oriented to person, place, and time. No distress.  HENT:  Head: Normocephalic and atraumatic.  Right Ear: Tympanic membrane, external ear and ear canal normal.  Left Ear: Tympanic membrane, external ear and ear canal normal.  Mouth/Throat: Oropharynx is clear and moist. No oropharyngeal exudate.  Eyes: Conjunctivae and EOM are normal. Pupils are equal, round, and reactive to light.  Neck: Normal range of motion. Neck supple.  Cardiovascular: Normal rate, regular rhythm and normal heart sounds.   Pulmonary/Chest: Effort normal and breath sounds normal.  Abdominal: Soft. Bowel sounds are normal.  Neurological: He is alert and oriented to person, place, and time.  Skin: Skin is warm and dry. He is not diaphoretic.  Psychiatric: He has a normal mood and affect.    Labs reviewed: Basic Metabolic Panel:  Recent Labs  08/02/14 0403 08/03/14 0347 08/05/14 0330 09/25/14 0822  NA 135* 131* 131* 137  K 3.9 3.4* 3.6* 4.0  CL 96 91* 90* 96  CO2 _0 --   GLUCOSE 139* 128* 95 167*  BUN _1 CREATININE 1.25 1.29 1.25 1.00  CALCIUM 8.2* 8.6 8.6  --    Liver Function Tests:  Recent Labs  08/01/14 0034 08/01/14 0531  AST 36 32  ALT 28 27  ALKPHOS 144* 127*  BILITOT 0.4 0.4  PROT 7.6 7.0  ALBUMIN 3.8 3.5   No results for input(s): LIPASE, AMYLASE in the last 8760 hours. No results for input(s): AMMONIA in the last 8760 hours. CBC:  Recent Labs  08/01/14 0034 08/01/14 0531  08/04/14 0351 08/05/14 0330 09/18/14 0606 09/25/14 0822  WBC 17.2* 17.8*  < > 13.2* 9.5 10.7*  --   NEUTROABS 15.1* 16.0*  --   --   --   --   --   HGB 15.3 13.4  < > 6.9* 7.7* 12.6* 9.9*  HCT 44.6 40.4  < > 19.5* 22.4* 38.8* 29.0*  MCV 91.6 92.0  < > 87.4 85.5 95.6  --   PLT 205 218  < > 154 182 244  --   < > = values in this interval not displayed. TSH: No results for input(s): TSH  in the last 8760 hours. A1C: No results found for: HGBA1C Lipid Panel: No results for input(s): CHOL, HDL, LDLCALC, TRIG, CHOLHDL, LDLDIRECT in the last 8760 hours.  CMP with Estimated GFR    Result: 10/18/2014 4:22 PM   ( Status: F )       Sodium 139     135-145 mEq/L SLN   Potassium 4.2     3.5-5.3 mEq/L  SLN   Chloride 103     96-112 mEq/L SLN   CO2 28     19-32 mEq/L SLN   Glucose 71     70-99 mg/dL SLN   BUN 17     6-23 mg/dL SLN   Creatinine 1.05     0.50-1.35 mg/dL SLN   Bilirubin, Total 0.2     0.2-1.2 mg/dL SLN   Alkaline Phosphatase 169   H 39-117 U/L SLN   AST/SGOT 21     0-37 U/L SLN   ALT/SGPT 22     0-53 U/L SLN   Total Protein 6.4     6.0-8.3 g/dL SLN   Albumin 3.1   L 3.5-5.2 g/dL SLN   Calcium 8.9     8.4-10.5 mg/dL SLN   Est GFR, African American 73      mL/min SLN   Est GFR, NonAfrican American 63      mL/min SLN C CBC with Diff    Result: 10/18/2014 6:15 PM   ( Status: F )       WBC 9.2     4.0-10.5 K/uL SLN   RBC 3.68   L 4.22-5.81 MIL/uL SLN   Hemoglobin 10.9   L 13.0-17.0 g/dL SLN   Hematocrit 34.0   L 39.0-52.0 % SLN   MCV 92.4     78.0-100.0 fL SLN   MCH 29.6     26.0-34.0 pg SLN   MCHC 32.1     30.0-36.0 g/dL SLN   RDW 14.6     11.5-15.5 % SLN   Platelet Count 339     150-400 K/uL SLN   MPV 10.1     8.6-12.4 fL SLN   Granulocyte % 68     43-77 % SLN   Absolute Gran 6.3     1.7-7.7 K/uL SLN   Lymph % 17     12-46 % SLN   Absolute Lymph 1.6     0.7-4.0 K/uL SLN   Mono % 11     3-12 % SLN   Absolute Mono 1.0     0.1-1.0 K/uL SLN   Eos % 4     0-5 % SLN   Absolute Eos 0.4     0.0-0.7 K/uL SLN   Baso % 0     0-1 % SLN   Absolute Baso 0.0     0.0-0.1 K/uL SLN   Smear Review Criteria for review not met  SLN   Hemoglobin A1c with eAG    Result: 10/19/2014 12:01 AM   ( Status: F )       Hemoglobin A1C 6.2   H <5.7 % SLN C Estimated Average Glucose 131   H <117 mg/dL SLN   Assessment/Plan  1. Constipation, unspecified constipation type Much  improvement with amitiza pt complaints of chronic hemorrhoids associated with constipation, started on Prep H QID PRN  2. Diabetes mellitus type 2, controlled A1c at 6.2, conts on glyburide  3. Protein-calorie malnutrition, severe -weight has slowly increased, followed by RD at this time  4. Postoperative anemia hgb has improved to 10 at this time  5. Essential hypertension Blood pressure in appropriate range. Cont current medications   6. Right Heel Wound conts to followed by wound care, wound is stable-ongoing treatment by MD and treatment nurse

## 2014-12-06 ENCOUNTER — Non-Acute Institutional Stay (SKILLED_NURSING_FACILITY): Payer: Medicare Other | Admitting: Internal Medicine

## 2014-12-06 DIAGNOSIS — F0390 Unspecified dementia without behavioral disturbance: Secondary | ICD-10-CM

## 2014-12-08 ENCOUNTER — Encounter: Payer: Self-pay | Admitting: Internal Medicine

## 2014-12-08 NOTE — Assessment & Plan Note (Signed)
Pt still denies he "fell", he says he "tripped". Having said that his insight is poor but he now acknowledges  he needs help; he has a neighbor who will shop for him. He says that she visits him every Friday. I asked him to ask her if she will come to a care meeting the next Friday after so we can make plans with her. He might do OK in an assisted living type arrangment. If nothing else he physically is not able to take care of himself. He mentioned his 2 sisters in SNF that he has power of attorney over. This could be a problem.

## 2014-12-08 NOTE — Progress Notes (Signed)
MRN: [id number] Name: [name]  Sex: male Age: 81 y.o. DOB: 07-27-1924  [name] Facility[facility]Room: 306 Level Of Care: SNF Provider: Merrilee SeashoreALEXANDER, ANNE D Emergency Contacts: Extended Emergency Contact Information Primary Emergency Contact: Wilcher,[name]  [place] Home Phone: [phone] Relation: Daughter Secondary Emergency Contact: Lenier,[name] Address: [address]          [place], [place] [id number] [place] Home Phone: [phone] Relation: Nephew  Code Status:   Allergies: Peanuts  Chief Complaint  Patient presents with  . Acute Visit    HPI: Patient is 81 y.o. male who I am seeing per family request for reconsideration for d/c to home.  Past Medical History  Diagnosis Date  . Diabetes mellitus without complication   . Hypertension   . Wears glasses     Past Surgical History  Procedure Laterality Date  . Hip fracture surgery Right 08/01/2014    IM RIGHT HIP  . Femur im nail Right 08/01/2014    Procedure: INTRAMEDULLARY (IM) NAIL FEMORAL;  Surgeon: Cheral AlmasNaiping Michael Xu, MD;  Location: [facility];  Service: Orthopedics;  Laterality: Right;  . Nasal endoscopy with epistaxis control Right 09/25/2014    Procedure: ENDOSCOPIC CAUTERIZATION OF EPISTAXIS ;  Surgeon: Drema Halonhristopher E Newman, MD;  Location: [facility];  Service: ENT;  Laterality: Right;      Medication List       This list is accurate as of: 12/06/14 11:59 PM.  Always use your most recent med list.               acetaminophen 650 MG CR tablet  Commonly known as:  TYLENOL  Take 650 mg by mouth every 6 (six) hours as needed for pain or fever.     aspirin 325 MG tablet  Take 325 mg by mouth 2 (two) times daily.     atenolol 25 MG tablet  Commonly known as:  TENORMIN  Take 25 mg by mouth daily.     bisacodyl 10 MG suppository  Commonly known as:  DULCOLAX  Place 10 mg rectally daily as needed for moderate constipation.     glyBURIDE 5 MG  tablet  Commonly known as:  DIABETA  Take 5 mg by mouth daily.     lubiprostone 24 MCG capsule  Commonly known as:  AMITIZA  Take 24 mcg by mouth 2 (two) times daily with a meal.     magnesium hydroxide 400 MG/5ML suspension  Commonly known as:  MILK OF MAGNESIA  Take 30 mLs by mouth daily as needed for mild constipation.     multivitamin with minerals Tabs tablet  Take 1 tablet by mouth daily.     ondansetron 4 MG tablet  Commonly known as:  ZOFRAN  Take 1 tablet (4 mg total) by mouth every 6 (six) hours as needed for nausea.     oxyCODONE 5 MG immediate release tablet  Commonly known as:  Oxy IR/ROXICODONE  Take 1-2 tablets by mouth every 4 hours as needed for moderate to severe pain.     polyethylene glycol packet  Commonly known as:  MIRALAX / GLYCOLAX  Take 17 g by mouth daily.     senna 8.6 MG Tabs tablet  Commonly known as:  SENOKOT  Take 1 tablet (8.6 mg total) by mouth 2 (two) times daily.     traMADol 50 MG tablet  Commonly known as:  ULTRAM  Take one tablet by mouth every 6 hours as needed for pain  No orders of the defined types were placed in this encounter.     There is no immunization history on file for this patient.  History  Substance Use Topics  . Smoking status: Former Games developer  . Smokeless tobacco: Never Used     Comment: QUIT SMOKING IN 1963  . Alcohol Use: No    Review of Systems  DATA OBTAINED: from patient, nurse, medical record GENERAL:  no fevers, fatigue, appetite changes SKIN: No itching, rash HEENT: No complaint RESPIRATORY: No cough, wheezing, SOB CARDIAC: No chest pain, palpitations, lower extremity edema  GI: No abdominal pain, No N/V/D or constipation, No heartburn or reflux  GU: No dysuria, frequency or urgency, or incontinence  MUSCULOSKELETAL: No unrelieved bone/joint pain NEUROLOGIC: No headache, dizziness  PSYCHIATRIC: No overt anxiety or sadness  Filed Vitals:   12/08/14 1424  BP: 112/65  Pulse: 44   Temp: 97.8 F (36.6 C)  Resp: 15    Physical Exam  GENERAL APPEARANCE: Alert, conversant, No acute distress  SKIN: No diaphoresis rash, or wounds HEENT: Unremarkable RESPIRATORY: Breathing is even, unlabored. Lung sounds are clear   CARDIOVASCULAR: Heart RRR no murmurs, rubs or gallops. No peripheral edema  GASTROINTESTINAL: Abdomen is soft, non-tender, not distended w/ normal bowel sounds.  GENITOURINARY: Bladder non tender, not distended  MUSCULOSKELETAL: No abnormal joints or musculature NEUROLOGIC: Cranial nerves 2-12 grossly intact. Moves all extremities PSYCHIATRIC: Mood and affect appropriate to situation, no behavioral issues  Patient Active Problem List   Diagnosis Date Noted  . Constipation 11/09/2014  . Dementia without behavioral disturbance 10/20/2014  . Protein-calorie malnutrition, severe 08/02/2014  . UTI (urinary tract infection) 08/02/2014  . Postoperative anemia 08/02/2014  . Hip fracture requiring operative repair 08/01/2014  . Hip fracture 08/01/2014  . Diabetes mellitus type 2, controlled 08/01/2014  . Essential hypertension 08/01/2014    CBC    Component Value Date/Time   WBC 10.7* 09/18/2014 0606   RBC 4.06* 09/18/2014 0606   HGB 9.9* 09/25/2014 0822   HCT 29.0* 09/25/2014 0822   PLT 244 09/18/2014 0606   MCV 95.6 09/18/2014 0606   LYMPHSABS 1.1 08/01/2014 0531   MONOABS 0.7 08/01/2014 0531   EOSABS 0.0 08/01/2014 0531   BASOSABS 0.0 08/01/2014 0531    CMP     Component Value Date/Time   NA 137 09/25/2014 0822   K 4.0 09/25/2014 0822   CL 96 09/25/2014 0822   CO2 27 08/05/2014 0330   GLUCOSE 167* 09/25/2014 0822   BUN 20 09/25/2014 0822   CREATININE 1.00 09/25/2014 0822   CALCIUM 8.6 08/05/2014 0330   PROT 7.0 08/01/2014 0531   ALBUMIN 3.5 08/01/2014 0531   AST 32 08/01/2014 0531   ALT 27 08/01/2014 0531   ALKPHOS 127* 08/01/2014 0531   BILITOT 0.4 08/01/2014 0531   GFRNONAA 50* 08/05/2014 0330   GFRAA 57* 08/05/2014 0330     Assessment and Plan  No problem-specific assessment & plan notes found for this encounter.   Margit Hanks, MD

## 2014-12-12 ENCOUNTER — Encounter: Payer: Self-pay | Admitting: Vascular Surgery

## 2014-12-31 ENCOUNTER — Encounter: Payer: Self-pay | Admitting: Vascular Surgery

## 2015-01-01 ENCOUNTER — Encounter: Payer: Self-pay | Admitting: Vascular Surgery

## 2015-01-01 ENCOUNTER — Ambulatory Visit (INDEPENDENT_AMBULATORY_CARE_PROVIDER_SITE_OTHER): Payer: Medicare Other | Admitting: Vascular Surgery

## 2015-01-01 VITALS — BP 138/72 | HR 70 | Resp 18 | Ht 67.0 in | Wt 121.8 lb

## 2015-01-01 DIAGNOSIS — I739 Peripheral vascular disease, unspecified: Secondary | ICD-10-CM

## 2015-01-01 NOTE — Progress Notes (Signed)
Patient name: [name] MRN: [id number] DOB: 1924-06-18 Sex: male   Referred by: Ronne Binning  Reason for referral:  Chief Complaint  Patient presents with  . New Evaluation    non-healing ulcer right heel (3 months)      HISTORY OF PRESENT ILLNESS: The patient presents today for evaluation of right heel ulcer. He suffered a right hip fracture approximately 4 months ago and developed a heel ulcer is a pressure sore. He is recovering from hip fracture and reports that he is able walk with a walker. He is in a nursing facility following a hip fracture. Prior to visit patient reports that he was independent. He reports minimal discomfort associated with heel ulcer. He does report some knee pain. No prior history of claudication no prior history of lower extremity tissue loss. He is quite alert and communicative at his age of 55. 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  Past Medical History  Diagnosis Date  . Diabetes mellitus without complication   . Hypertension   . Wears glasses     Past Surgical History  Procedure Laterality Date  . Hip fracture surgery Right 08/01/2014    IM RIGHT HIP  . Femur im nail Right 08/01/2014    Procedure: INTRAMEDULLARY (IM) NAIL FEMORAL;  Surgeon: Cheral Almas, MD;  Location: [facility];  Service: Orthopedics;  Laterality: Right;  . Nasal endoscopy with epistaxis control Right 09/25/2014    Procedure: ENDOSCOPIC CAUTERIZATION OF EPISTAXIS ;  Surgeon: Drema Halon, MD;  Location:  SURGERY CENTER;  Service: ENT;  Laterality: Right;    History   Social History  . Marital Status: Divorced    Spouse Name: N/A  . Number of Children: N/A  . Years of Education: N/A   Occupational History  . Not on file.   Social History Main Topics  . Smoking status: Former Games developer  . Smokeless tobacco: Never Used     Comment: QUIT SMOKING IN 1963  . Alcohol Use: No  . Drug Use: No  . Sexual Activity: Not on file    Other Topics Concern  . Not on file   Social History Narrative    Family History  Problem Relation Age of Onset  . Other Neg Hx     Allergies as of 01/01/2015 - Review Complete 01/01/2015  Allergen Reaction Noted  . Peanuts [peanut oil] Hives 09/18/2014    Current Outpatient Prescriptions on File Prior to Visit  Medication Sig Dispense Refill  . acetaminophen (TYLENOL) 650 MG CR tablet Take 650 mg by mouth every 6 (six) hours as needed for pain or fever.    Marland Kitchen atenolol (TENORMIN) 25 MG tablet Take 25 mg by mouth daily.    . bisacodyl (DULCOLAX) 10 MG suppository Place 10 mg rectally daily as needed for moderate constipation.    Marland Kitchen glyBURIDE (DIABETA) 5 MG tablet Take 5 mg by mouth daily.    Marland Kitchen lubiprostone (AMITIZA) 24 MCG capsule Take 24 mcg by mouth 2 (two) times daily with a meal.    . magnesium hydroxide (MILK OF MAGNESIA) 400 MG/5ML suspension Take 30 mLs by mouth daily as needed for mild constipation.    . Multiple Vitamin (MULTIVITAMIN WITH MINERALS) TABS tablet Take 1 tablet by mouth daily.    . ondansetron (ZOFRAN) 4 MG tablet Take 1 tablet (4 mg total) by mouth every 6 (six) hours as needed for nausea. 20 tablet 0  . oxyCODONE (OXY IR/ROXICODONE) 5 MG immediate  release tablet Take 1-2 tablets by mouth every 4 hours as needed for moderate to severe pain. 90 tablet 0  . polyethylene glycol (MIRALAX / GLYCOLAX) packet Take 17 g by mouth daily.    Marland Kitchen. senna (SENOKOT) 8.6 MG TABS tablet Take 1 tablet (8.6 mg total) by mouth 2 (two) times daily. 120 each 0  . traMADol (ULTRAM) 50 MG tablet Take one tablet by mouth every 6 hours as needed for pain 120 tablet 5  . aspirin 325 MG tablet Take 325 mg by mouth 2 (two) times daily.     No current facility-administered medications on file prior to visit.     REVIEW OF SYSTEMS:  Positives indicated with an "X"  CARDIOVASCULAR:  [ ]  chest pain   [ ]  chest pressure   [ ]  palpitations   [ ]  orthopnea   [ ]  dyspnea on exertion   [ ]   claudication   [ ]  rest pain   [ ]  DVT   [ ]  phlebitis PULMONARY:   [ ]  productive cough   [ ]  asthma   [ ]  wheezing NEUROLOGIC:   [ ]  weakness  [ ]  paresthesias  [ ]  aphasia  [ ]  amaurosis  [ ]  dizziness HEMATOLOGIC:   [ ]  bleeding problems   [ ]  clotting disorders MUSCULOSKELETAL:  [ ]  joint pain   [ ]  joint swelling GASTROINTESTINAL: [ ]   blood in stool  [ ]   hematemesis GENITOURINARY:  [ ]   dysuria  [ ]   hematuria PSYCHIATRIC:  [ ]  history of major depression INTEGUMENTARY:  [ ]  rashes  [ ]  ulcers CONSTITUTIONAL:  [ ]  fever   [ ]  chills  PHYSICAL EXAMINATION:  General: The patient is a well-nourished male, in no acute distress.currently sitting in a wheelchair Vital signs are BP 138/72 mmHg  Pulse 70  Resp 18  Ht 5\' 7"  (1.702 m)  Wt 121 lb 12.8 oz (55.248 kg)  BMI 19.07 kg/m2 Pulmonary: There is a good air exchange bilaterally without wheezing or rales. Abdomen: Soft and non-tender with normal pitch bowel sounds.no masses noted Musculoskeletal: There are no major deformities.  There is no significant extremity pain. Neurologic: No focal weakness or paresthesias are detected, Skin: does have a 2 x 3 cm ulcer over his right lateral heel. Moderate granulating base with no surrounding erythema Psychiatric: The patient has normal affect. Cardiovascular: There is a regular rate and rhythm without significant murmur appreciated. 2 posterior lateral pulses are 2+ radial pulses. I cannot palpate popliteal or distal pulses   Outpatient lab studies suggest markedly diminished ankle arm index of 0.3 on the right. This was from outside lab on 11/29/2014  Impression and Plan:  Nonhealing ulcer right heel. This is related to a pressure sore that he had after her hip fracture. I do not feel he has adequate arch her foot healed this. I have recommended arteriography for further evaluation. At his advanced age hopefully will have endovascular option. If you would require extensive for bypass, we  would have to have extensive discussion and evaluation determined this was his best option. He wishes to speak with his family prior to making this decision    Khayree Delellis [facility] Office: [phone]

## 2015-01-08 ENCOUNTER — Non-Acute Institutional Stay (SKILLED_NURSING_FACILITY): Payer: Medicare Other | Admitting: Nurse Practitioner

## 2015-01-08 DIAGNOSIS — E119 Type 2 diabetes mellitus without complications: Secondary | ICD-10-CM

## 2015-01-08 DIAGNOSIS — K5900 Constipation, unspecified: Secondary | ICD-10-CM | POA: Diagnosis not present

## 2015-01-08 DIAGNOSIS — I10 Essential (primary) hypertension: Secondary | ICD-10-CM

## 2015-01-08 DIAGNOSIS — E43 Unspecified severe protein-calorie malnutrition: Secondary | ICD-10-CM | POA: Diagnosis not present

## 2015-01-08 DIAGNOSIS — S91301D Unspecified open wound, right foot, subsequent encounter: Secondary | ICD-10-CM

## 2015-01-08 NOTE — Progress Notes (Signed)
Patient ID: [name], male   DOB: 31-May-1924, 81 y.o.   MRN: [id number]    Nursing Home Location:  [place] of Service: SNF (74)  PCP: [name], MD  Allergies  Allergen Reactions  . Peanuts [Peanut [place]ente    Chief Complaint  Patient presents with  . Medical Management of Chronic Issues    HPI:  Patient is a 81 y.o. male seen today at [facility] for routine follow up on chronic conditions. Pt with a pmh of DM, HTN, hip fracture s/p repair. Pt has been followed at [facility] due to slow healing right heel wound. Was seen at vein and vascular, who would like to do further studies. Wound is slowly healing. Pt without acute concerns. Report mood has been good. No changes in bowel or bladder.   Review of Systems:  Review of Systems  Constitutional: Negative for activity change, appetite change, fatigue and unexpected weight change.  HENT: Negative for congestion.   Eyes: Negative.   Respiratory: Negative for cough and shortness of breath.   Cardiovascular: Negative for chest pain, palpitations and leg swelling.  Gastrointestinal: Positive for constipation (has improved). Negative for abdominal pain and diarrhea.  Genitourinary: Negative for dysuria and difficulty urinating.  Musculoskeletal: Negative for myalgias and arthralgias.  Skin: Positive for wound (*right heel wound). Negative for color change.  Neurological: Negative for dizziness and weakness.  Psychiatric/Behavioral: Negative for behavioral problems, confusion and agitation.    Past Medical History  Diagnosis Date  . Diabetes mellitus without complication   . Hypertension   . Wears glasses    Past Surgical History  Procedure Laterality Date  . Hip fracture surgery Right 08/01/2014    IM RIGHT HIP  . Femur im nail Right 08/01/2014    Procedure: INTRAMEDULLARY (IM) NAIL FEMORAL;  Surgeon: Marianna Payment, MD;  Location: [place];  Service: Orthopedics;   Laterality: Right;  . Nasal endoscopy with epistaxis control Right 09/25/2014    Procedure: ENDOSCOPIC CAUTERIZATION OF EPISTAXIS ;  Surgeon: Rozetta Nunnery, MD;  Location: [facility];  Service: ENT;  Laterality: Right;   Social History:   reports that he has quit smoking. He has never used smokeless tobacco. He reports that he does not drink alcohol or use illicit drugs.  Family History  Problem Relation Age of Onset  . Other Neg Hx     Medications: Patient's Medications  New Prescriptions   No medications on file  Previous Medications   ACETAMINOPHEN (TYLENOL) 650 MG CR TABLET    Take 650 mg by mouth every 6 (six) hours as needed for pain or fever.   ASPIRIN 325 MG TABLET    Take 325 mg by mouth 2 (two) times daily.   ATENOLOL (TENORMIN) 25 MG TABLET    Take 25 mg by mouth daily.   BISACODYL (DULCOLAX) 10 MG SUPPOSITORY    Place 10 mg rectally daily as needed for moderate constipation.   COLLAGENASE (SANTYL EX)    Apply topically 2 (two) times daily.   GLYBURIDE (DIABETA) 5 MG TABLET    Take 5 mg by mouth daily.   LUBIPROSTONE (AMITIZA) 24 MCG CAPSULE    Take 24 mcg by mouth 2 (two) times daily with a meal.   MAGNESIUM HYDROXIDE (MILK OF MAGNESIA) 400 MG/5ML SUSPENSION    Take 30 mLs by mouth daily as needed for mild constipation.   MULTIPLE VITAMIN (MULTIVITAMIN WITH MINERALS) TABS TABLET  Take 1 tablet by mouth daily.   ONDANSETRON (ZOFRAN) 4 MG TABLET    Take 1 tablet (4 mg total) by mouth every 6 (six) hours as needed for nausea.   OXYCODONE (OXY IR/ROXICODONE) 5 MG IMMEDIATE RELEASE TABLET    Take 1-2 tablets by mouth every 4 hours as needed for moderate to severe pain.   POLYETHYLENE GLYCOL (MIRALAX / GLYCOLAX) PACKET    Take 17 g by mouth daily.   SENNA (SENOKOT) 8.6 MG TABS TABLET    Take 1 tablet (8.6 mg total) by mouth 2 (two) times daily.   TRAMADOL (ULTRAM) 50 MG TABLET    Take one tablet by mouth every 6 hours as needed for pain  Modified  Medications   No medications on file  Discontinued Medications   No medications on file     Physical Exam: Filed Vitals:   01/08/15 1736  BP: 140/70  Pulse: 78  Temp: 98 F (36.7 C)  Resp: 18  Weight: 130 lb (58.968 kg)    Physical Exam  Constitutional: He is oriented to person, place, and time. No distress.  HENT:  Head: Normocephalic and atraumatic.  Right Ear: Tympanic membrane, external ear and ear canal normal.  Left Ear: Tympanic membrane, external ear and ear canal normal.  Mouth/Throat: Oropharynx is clear and moist. No oropharyngeal exudate.  Eyes: Conjunctivae and EOM are normal. Pupils are equal, round, and reactive to light.  Neck: Normal range of motion. Neck supple.  Cardiovascular: Normal rate, regular rhythm and normal heart sounds.   Pulmonary/Chest: Effort normal and breath sounds normal.  Abdominal: Soft. Bowel sounds are normal.  Neurological: He is alert and oriented to person, place, and time.  Skin: Skin is warm and dry. He is not diaphoretic.  Psychiatric: He has a normal mood and affect.    Labs reviewed: Basic Metabolic Panel:  Recent Labs  08/02/14 0403 08/03/14 0347 08/05/14 0330 09/25/14 0822  NA 135* 131* 131* 137  K 3.9 3.4* 3.6* 4.0  CL 96 91* 90* 96  CO2 _0 --   GLUCOSE 139* 128* 95 167*  BUN _1 CREATININE 1.25 1.29 1.25 1.00  CALCIUM 8.2* 8.6 8.6  --    Liver Function Tests:  Recent Labs  08/01/14 0034 08/01/14 0531  AST 36 32  ALT 28 27  ALKPHOS 144* 127*  BILITOT 0.4 0.4  PROT 7.6 7.0  ALBUMIN 3.8 3.5   No results for input(s): LIPASE, AMYLASE in the last 8760 hours. No results for input(s): AMMONIA in the last 8760 hours. CBC:  Recent Labs  08/01/14 0034 08/01/14 0531  08/04/14 0351 08/05/14 0330 09/18/14 0606 09/25/14 0822  WBC 17.2* 17.8*  < > 13.2* 9.5 10.7*  --   NEUTROABS 15.1* 16.0*  --   --   --   --   --   HGB 15.3 13.4  < > 6.9* 7.7* 12.6* 9.9*  HCT 44.6 40.4  < > 19.5*  22.4* 38.8* 29.0*  MCV 91.6 92.0  < > 87.4 85.5 95.6  --   PLT 205 218  < > 154 182 244  --   < > = values in this interval not displayed. TSH: No results for input(s): TSH in the last 8760 hours. A1C: No results found for: HGBA1C Lipid Panel: No results for input(s): CHOL, HDL, LDLCALC, TRIG, CHOLHDL, LDLDIRECT in the last 8760 hours.  CMP with Estimated GFR    Result: 10/18/2014 4:22 PM   (  Status: F )       Sodium 139     135-145 mEq/L SLN   Potassium 4.2     3.5-5.3 mEq/L SLN   Chloride 103     96-112 mEq/L SLN   CO2 28     19-32 mEq/L SLN   Glucose 71     70-99 mg/dL SLN   BUN 17     6-23 mg/dL SLN   Creatinine 1.05     0.50-1.35 mg/dL SLN   Bilirubin, Total 0.2     0.2-1.2 mg/dL SLN   Alkaline Phosphatase 169   H 39-117 U/L SLN   AST/SGOT 21     0-37 U/L SLN   ALT/SGPT 22     0-53 U/L SLN   Total Protein 6.4     6.0-8.3 g/dL SLN   Albumin 3.1   L 3.5-5.2 g/dL SLN   Calcium 8.9     8.4-10.5 mg/dL SLN   Est GFR, African American 73      mL/min SLN   Est GFR, NonAfrican American 63      mL/min SLN C CBC with Diff    Result: 10/18/2014 6:15 PM   ( Status: F )       WBC 9.2     4.0-10.5 K/uL SLN   RBC 3.68   L 4.22-5.81 MIL/uL SLN   Hemoglobin 10.9   L 13.0-17.0 g/dL SLN   Hematocrit 34.0   L 39.0-52.0 % SLN   MCV 92.4     78.0-100.0 fL SLN   MCH 29.6     26.0-34.0 pg SLN   MCHC 32.1     30.0-36.0 g/dL SLN   RDW 14.6     11.5-15.5 % SLN   Platelet Count 339     150-400 K/uL SLN   MPV 10.1     8.6-12.4 fL SLN   Granulocyte % 68     43-77 % SLN   Absolute Gran 6.3     1.7-7.7 K/uL SLN   Lymph % 17     12-46 % SLN   Absolute Lymph 1.6     0.7-4.0 K/uL SLN   Mono % 11     3-12 % SLN   Absolute Mono 1.0     0.1-1.0 K/uL SLN   Eos % 4     0-5 % SLN   Absolute Eos 0.4     0.0-0.7 K/uL SLN   Baso % 0     0-1 % SLN   Absolute Baso 0.0     0.0-0.1 K/uL SLN   Smear Review Criteria for review not met  SLN   Hemoglobin A1c with eAG    Result: 10/19/2014 12:01 AM   ( Status: F )        Hemoglobin A1C 6.2   H <5.7 % SLN C Estimated Average Glucose 131   H <117 mg/dL SLN   Assessment/Plan  1. Constipation, unspecified constipation type Improved control on amitiza, will cont at this time  2. Diabetes mellitus type 2, controlled A1c at goal on last labs, conts glyburide  3. Open wound of right heel, subsequent encounter Dx with PVD at vein and vascular, conts on ASA Ongoing follow up with treatment team at [facility]  4. Essential hypertension Controlled on current regimen  5. Protein-calorie malnutrition, severe -pts has increased PO intake, weight has improved since he has been at [facility]

## 2015-02-20 ENCOUNTER — Encounter (HOSPITAL_BASED_OUTPATIENT_CLINIC_OR_DEPARTMENT_OTHER): Payer: Medicare Other | Attending: Surgery

## 2015-03-28 ENCOUNTER — Ambulatory Visit (HOSPITAL_COMMUNITY)
Admission: RE | Admit: 2015-03-28 | Discharge: 2015-03-28 | Disposition: A | Discharge status: Home / Self Care | Payer: Medicare Other | Source: Ambulatory Visit | Attending: Internal Medicine | Admitting: Internal Medicine

## 2015-03-28 ENCOUNTER — Other Ambulatory Visit: Payer: Self-pay | Admitting: Internal Medicine

## 2015-03-28 ENCOUNTER — Encounter (HOSPITAL_BASED_OUTPATIENT_CLINIC_OR_DEPARTMENT_OTHER): Payer: Medicare Other | Attending: Internal Medicine

## 2015-03-28 DIAGNOSIS — L97419 Non-pressure chronic ulcer of right heel and midfoot with unspecified severity: Secondary | ICD-10-CM | POA: Insufficient documentation

## 2015-03-28 DIAGNOSIS — M869 Osteomyelitis, unspecified: Secondary | ICD-10-CM

## 2015-03-28 DIAGNOSIS — E11621 Type 2 diabetes mellitus with foot ulcer: Secondary | ICD-10-CM | POA: Insufficient documentation

## 2015-03-28 DIAGNOSIS — L97512 Non-pressure chronic ulcer of other part of right foot with fat layer exposed: Secondary | ICD-10-CM | POA: Insufficient documentation

## 2015-03-28 DIAGNOSIS — I10 Essential (primary) hypertension: Secondary | ICD-10-CM | POA: Insufficient documentation

## 2015-03-28 DIAGNOSIS — I70234 Atherosclerosis of native arteries of right leg with ulceration of heel and midfoot: Secondary | ICD-10-CM | POA: Diagnosis not present

## 2015-03-28 DIAGNOSIS — I739 Peripheral vascular disease, unspecified: Secondary | ICD-10-CM | POA: Insufficient documentation

## 2015-03-28 DIAGNOSIS — L98499 Non-pressure chronic ulcer of skin of other sites with unspecified severity: Secondary | ICD-10-CM | POA: Diagnosis present

## 2015-04-04 DIAGNOSIS — I10 Essential (primary) hypertension: Secondary | ICD-10-CM | POA: Diagnosis not present

## 2015-04-04 DIAGNOSIS — L97512 Non-pressure chronic ulcer of other part of right foot with fat layer exposed: Secondary | ICD-10-CM | POA: Diagnosis not present

## 2015-04-04 DIAGNOSIS — E11621 Type 2 diabetes mellitus with foot ulcer: Secondary | ICD-10-CM | POA: Diagnosis not present

## 2015-04-04 DIAGNOSIS — I739 Peripheral vascular disease, unspecified: Secondary | ICD-10-CM | POA: Diagnosis not present

## 2015-04-11 ENCOUNTER — Encounter (HOSPITAL_BASED_OUTPATIENT_CLINIC_OR_DEPARTMENT_OTHER): Payer: Medicare Other | Attending: Internal Medicine

## 2015-04-11 DIAGNOSIS — I739 Peripheral vascular disease, unspecified: Secondary | ICD-10-CM | POA: Diagnosis not present

## 2015-04-11 DIAGNOSIS — I10 Essential (primary) hypertension: Secondary | ICD-10-CM | POA: Insufficient documentation

## 2015-04-11 DIAGNOSIS — E11621 Type 2 diabetes mellitus with foot ulcer: Secondary | ICD-10-CM | POA: Insufficient documentation

## 2015-04-11 DIAGNOSIS — L89613 Pressure ulcer of right heel, stage 3: Secondary | ICD-10-CM | POA: Diagnosis present

## 2015-04-15 ENCOUNTER — Telehealth: Payer: Self-pay

## 2015-04-15 NOTE — Telephone Encounter (Signed)
Attempted to call pt. in follow-up to recommendation by Dr. Arbie Cookey, for Aortogram with bilateral runoff and poss. intervention to right LE; pt. didn't answer phone.  LVM @ pt's daughter's phone to call office re: follow-up on right heel ulcer, and rescheduling appt. with Dr. Arbie Cookey for angiogram.

## 2015-04-18 DIAGNOSIS — E11621 Type 2 diabetes mellitus with foot ulcer: Secondary | ICD-10-CM | POA: Diagnosis not present

## 2015-04-18 DIAGNOSIS — I10 Essential (primary) hypertension: Secondary | ICD-10-CM | POA: Diagnosis not present

## 2015-04-18 DIAGNOSIS — L89613 Pressure ulcer of right heel, stage 3: Secondary | ICD-10-CM | POA: Diagnosis not present

## 2015-04-18 DIAGNOSIS — I739 Peripheral vascular disease, unspecified: Secondary | ICD-10-CM | POA: Diagnosis not present

## 2015-04-25 DIAGNOSIS — E11621 Type 2 diabetes mellitus with foot ulcer: Secondary | ICD-10-CM | POA: Diagnosis not present

## 2015-04-25 DIAGNOSIS — I739 Peripheral vascular disease, unspecified: Secondary | ICD-10-CM | POA: Diagnosis not present

## 2015-04-25 DIAGNOSIS — L89613 Pressure ulcer of right heel, stage 3: Secondary | ICD-10-CM | POA: Diagnosis not present

## 2015-04-25 DIAGNOSIS — I10 Essential (primary) hypertension: Secondary | ICD-10-CM | POA: Diagnosis not present

## 2015-05-02 DIAGNOSIS — I10 Essential (primary) hypertension: Secondary | ICD-10-CM | POA: Diagnosis not present

## 2015-05-02 DIAGNOSIS — E11621 Type 2 diabetes mellitus with foot ulcer: Secondary | ICD-10-CM | POA: Diagnosis not present

## 2015-05-02 DIAGNOSIS — L89613 Pressure ulcer of right heel, stage 3: Secondary | ICD-10-CM | POA: Diagnosis not present

## 2015-05-02 DIAGNOSIS — I739 Peripheral vascular disease, unspecified: Secondary | ICD-10-CM | POA: Diagnosis not present

## 2015-05-09 ENCOUNTER — Encounter (HOSPITAL_BASED_OUTPATIENT_CLINIC_OR_DEPARTMENT_OTHER): Payer: Medicare Other | Attending: Internal Medicine

## 2015-05-09 DIAGNOSIS — I739 Peripheral vascular disease, unspecified: Secondary | ICD-10-CM | POA: Diagnosis not present

## 2015-05-09 DIAGNOSIS — L97413 Non-pressure chronic ulcer of right heel and midfoot with necrosis of muscle: Secondary | ICD-10-CM | POA: Insufficient documentation

## 2015-05-09 DIAGNOSIS — I10 Essential (primary) hypertension: Secondary | ICD-10-CM | POA: Diagnosis not present

## 2015-05-09 DIAGNOSIS — E11621 Type 2 diabetes mellitus with foot ulcer: Secondary | ICD-10-CM | POA: Diagnosis not present

## 2015-05-16 DIAGNOSIS — I10 Essential (primary) hypertension: Secondary | ICD-10-CM | POA: Diagnosis not present

## 2015-05-16 DIAGNOSIS — E11621 Type 2 diabetes mellitus with foot ulcer: Secondary | ICD-10-CM | POA: Diagnosis not present

## 2015-05-16 DIAGNOSIS — I739 Peripheral vascular disease, unspecified: Secondary | ICD-10-CM | POA: Diagnosis not present

## 2015-05-16 DIAGNOSIS — L97413 Non-pressure chronic ulcer of right heel and midfoot with necrosis of muscle: Secondary | ICD-10-CM | POA: Diagnosis not present

## 2015-05-22 ENCOUNTER — Telehealth: Payer: Self-pay | Admitting: *Deleted

## 2015-05-22 NOTE — Telephone Encounter (Signed)
PENDING FILE-  We last saw patient in April with Dr. Arbie Cookey for discussion of aortogram w/ possible intervention d/t non-healing ulcer per [facility] wound [facility]. We have not heard anything from the patient or his daughter since so I called the Wound Clinic about his ulcer. According to [facility], the patient's wound has shrunk from approximately 6 cm to less than 1 cm presently. Pt was last seen on 05-09-15 and was responding to their treatments. I told Joselyn Glassman to call us if any further needs.

## 2015-05-23 DIAGNOSIS — E11621 Type 2 diabetes mellitus with foot ulcer: Secondary | ICD-10-CM | POA: Diagnosis not present

## 2015-05-23 DIAGNOSIS — I739 Peripheral vascular disease, unspecified: Secondary | ICD-10-CM | POA: Diagnosis not present

## 2015-05-23 DIAGNOSIS — L97413 Non-pressure chronic ulcer of right heel and midfoot with necrosis of muscle: Secondary | ICD-10-CM | POA: Diagnosis not present

## 2015-05-23 DIAGNOSIS — I10 Essential (primary) hypertension: Secondary | ICD-10-CM | POA: Diagnosis not present

## 2015-05-30 DIAGNOSIS — I10 Essential (primary) hypertension: Secondary | ICD-10-CM | POA: Diagnosis not present

## 2015-05-30 DIAGNOSIS — I739 Peripheral vascular disease, unspecified: Secondary | ICD-10-CM | POA: Diagnosis not present

## 2015-05-30 DIAGNOSIS — E11621 Type 2 diabetes mellitus with foot ulcer: Secondary | ICD-10-CM | POA: Diagnosis not present

## 2015-05-30 DIAGNOSIS — L97413 Non-pressure chronic ulcer of right heel and midfoot with necrosis of muscle: Secondary | ICD-10-CM | POA: Diagnosis not present

## 2015-06-06 DIAGNOSIS — I739 Peripheral vascular disease, unspecified: Secondary | ICD-10-CM | POA: Diagnosis not present

## 2015-06-06 DIAGNOSIS — E11621 Type 2 diabetes mellitus with foot ulcer: Secondary | ICD-10-CM | POA: Diagnosis not present

## 2015-06-06 DIAGNOSIS — L97413 Non-pressure chronic ulcer of right heel and midfoot with necrosis of muscle: Secondary | ICD-10-CM | POA: Diagnosis not present

## 2015-06-06 DIAGNOSIS — I10 Essential (primary) hypertension: Secondary | ICD-10-CM | POA: Diagnosis not present

## 2015-06-13 ENCOUNTER — Encounter (HOSPITAL_BASED_OUTPATIENT_CLINIC_OR_DEPARTMENT_OTHER): Payer: Medicare Other | Attending: Internal Medicine

## 2015-06-13 DIAGNOSIS — Z8631 Personal history of diabetic foot ulcer: Secondary | ICD-10-CM | POA: Insufficient documentation

## 2015-06-13 DIAGNOSIS — E1151 Type 2 diabetes mellitus with diabetic peripheral angiopathy without gangrene: Secondary | ICD-10-CM | POA: Insufficient documentation

## 2015-06-13 DIAGNOSIS — I10 Essential (primary) hypertension: Secondary | ICD-10-CM | POA: Insufficient documentation

## 2015-06-20 DIAGNOSIS — E1151 Type 2 diabetes mellitus with diabetic peripheral angiopathy without gangrene: Secondary | ICD-10-CM | POA: Diagnosis not present

## 2015-06-20 DIAGNOSIS — I10 Essential (primary) hypertension: Secondary | ICD-10-CM | POA: Diagnosis not present

## 2015-06-20 DIAGNOSIS — Z8631 Personal history of diabetic foot ulcer: Secondary | ICD-10-CM | POA: Diagnosis not present

## 2016-07-13 IMAGING — CR DG HIP COMPLETE 2+V*R*
3 series · 3 of 3 positions shown · non-contrast
Comparison: None.

CLINICAL DATA: Fall with right hip pain

EXAM:
RIGHT HIP - COMPLETE 2+ VIEW

[pelvis ap]
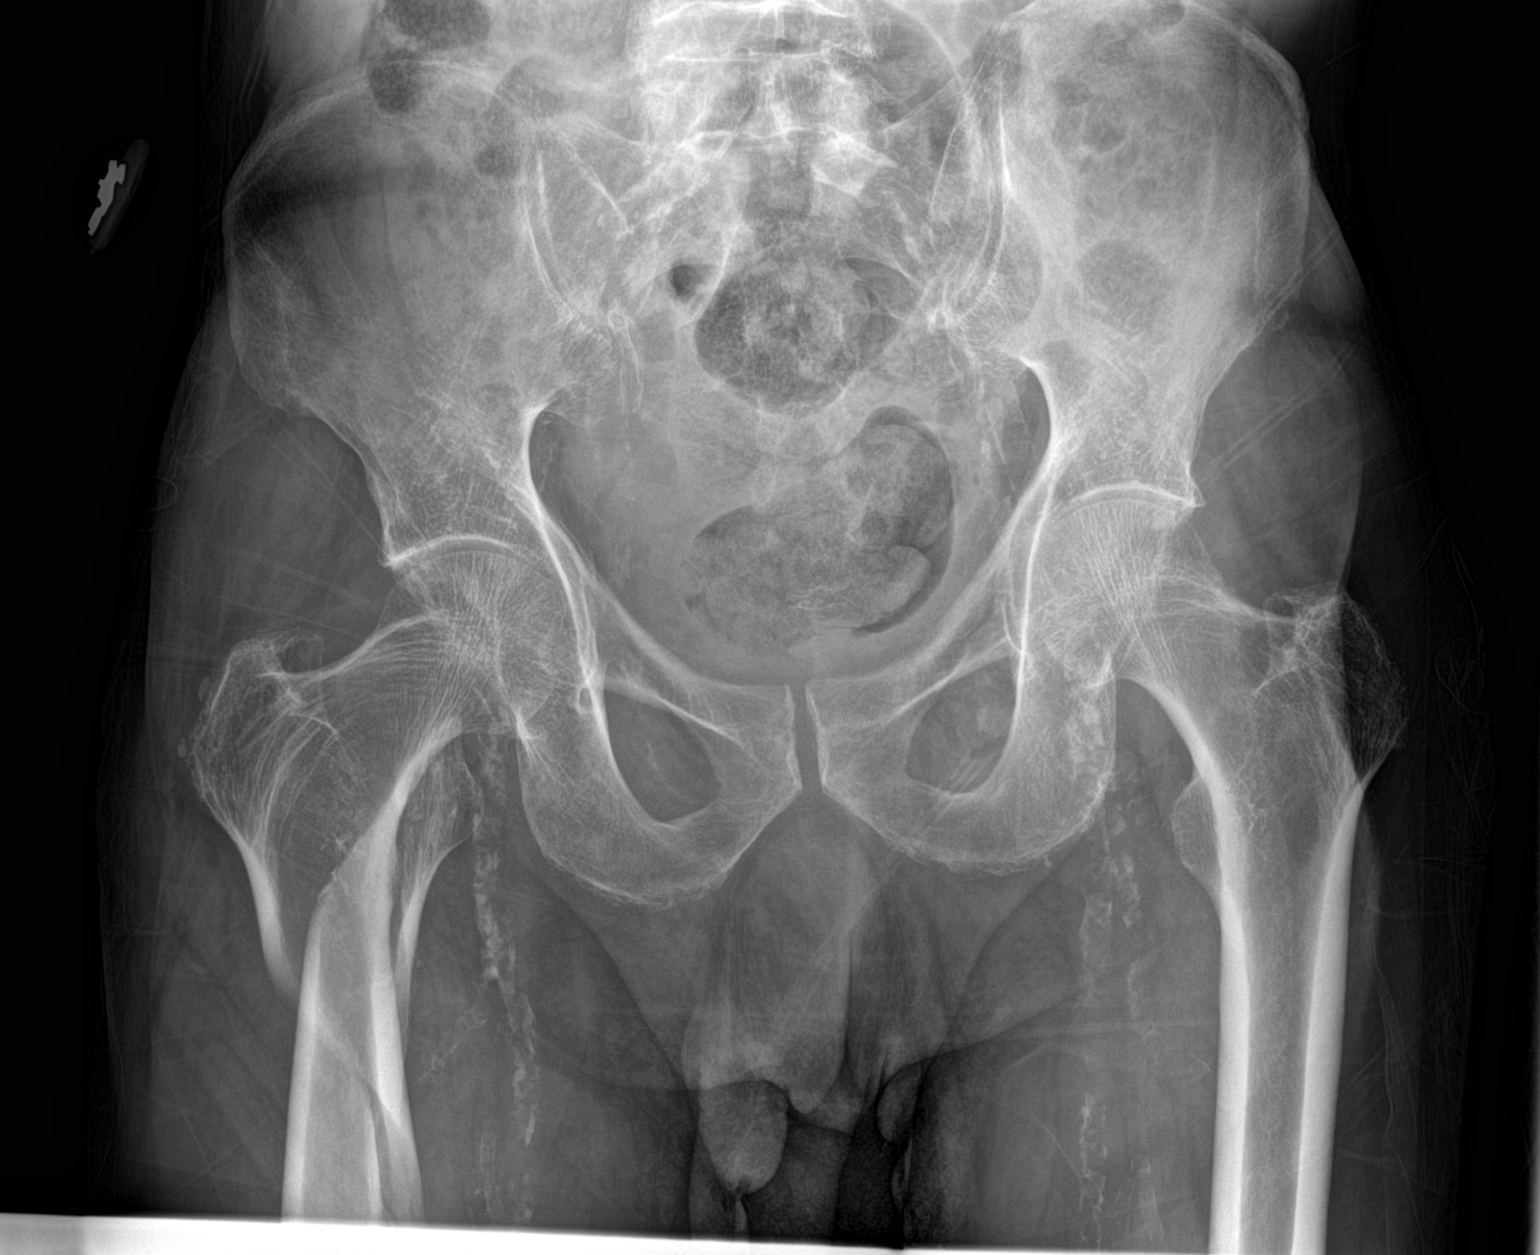

[hip ap]
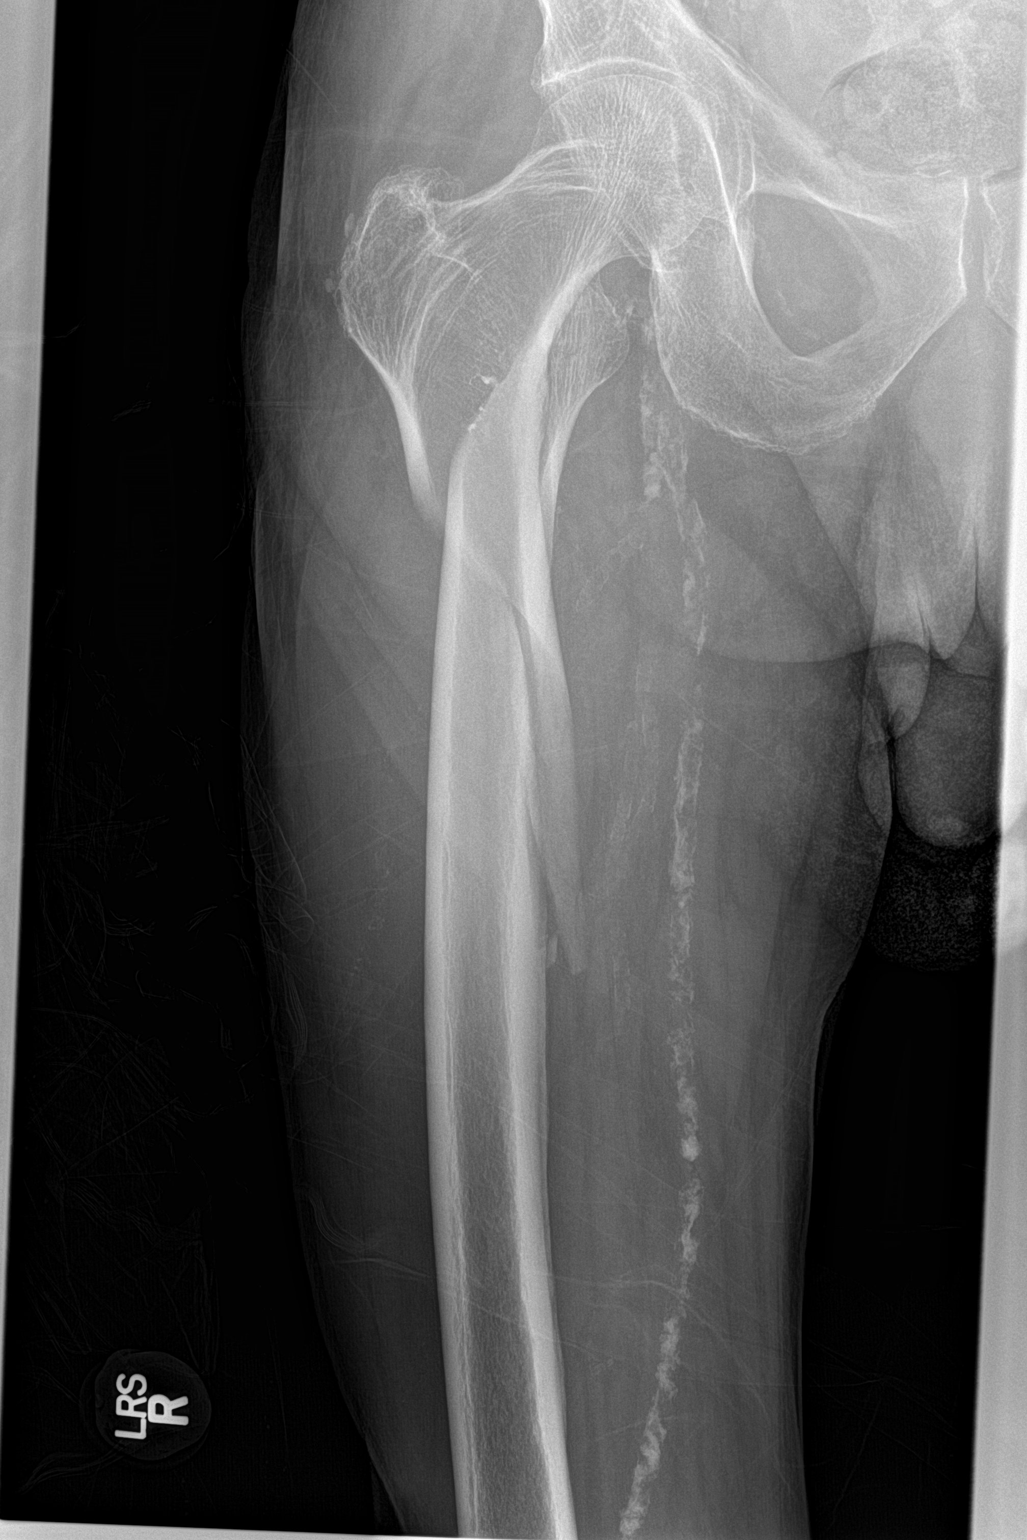

[hip lat]
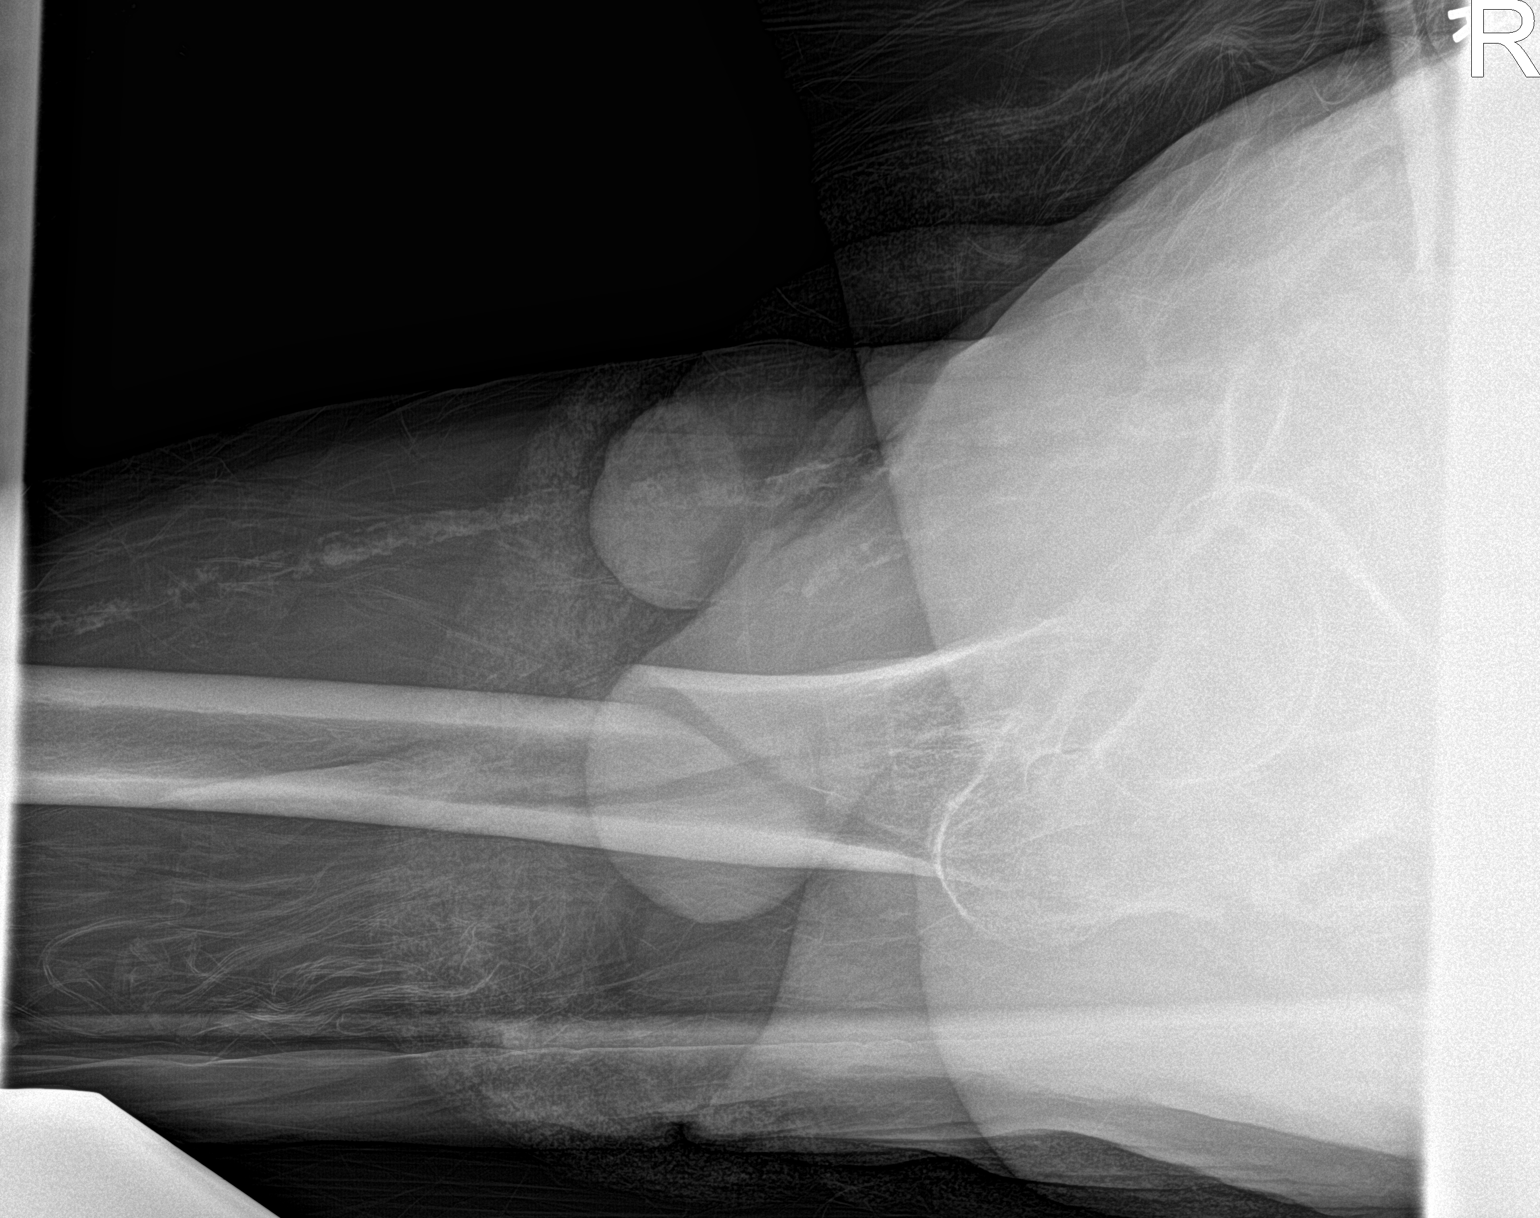

[3 of 3 positions shown; findings below may reference images not displayed]

FINDINGS: There is a spiral fracture of the proximal right femur, extending
from the upper diaphysis into the lesser trochanteric region. The
lesser trochanter is displaced proximally. No fracture identified
across the intertrochanteric line. Both hips are located. No
evidence of pelvic ring fracture. Osteopenia which is profound.
Diffuse arterial calcification.
IMPRESSION: Sub- and lesser trochanteric femur fracture on the right.

## 2020-12-25 ENCOUNTER — Other Ambulatory Visit: Payer: Self-pay

## 2020-12-25 ENCOUNTER — Ambulatory Visit (INDEPENDENT_AMBULATORY_CARE_PROVIDER_SITE_OTHER): Payer: Medicare HMO | Admitting: Podiatry

## 2020-12-25 DIAGNOSIS — B351 Tinea unguium: Secondary | ICD-10-CM

## 2020-12-25 DIAGNOSIS — M79675 Pain in left toe(s): Secondary | ICD-10-CM

## 2020-12-25 DIAGNOSIS — M79674 Pain in right toe(s): Secondary | ICD-10-CM

## 2020-12-25 DIAGNOSIS — E0843 Diabetes mellitus due to underlying condition with diabetic autonomic (poly)neuropathy: Secondary | ICD-10-CM

## 2020-12-25 NOTE — Progress Notes (Signed)
   SUBJECTIVE Patient with a history of diabetes mellitus presents to office today complaining of elongated, thickened nails that cause pain while ambulating in shoes.  He is unable to trim his own nails. Patient is here for further evaluation and treatment.   Past Medical History:  Diagnosis Date  . Diabetes mellitus without complication (HCC)   . Hypertension   . Wears glasses     OBJECTIVE General Patient is awake, alert, and oriented x 3 and in no acute distress. Derm Skin is dry and supple bilateral. Negative open lesions or macerations. Remaining integument unremarkable. Nails are tender, long, thickened and dystrophic with subungual debris, consistent with onychomycosis, 1-5 bilateral. No signs of infection noted. Vasc  DP and PT pedal pulses palpable bilaterally. Temperature gradient within normal limits.  Neuro Epicritic and protective threshold sensation diminished bilaterally.  Musculoskeletal Exam No symptomatic pedal deformities noted bilateral. Muscular strength within normal limits.  ASSESSMENT 1. Diabetes Mellitus w/ peripheral neuropathy 2. Onychomycosis of nail due to dermatophyte bilateral 3. Pain in foot bilateral  PLAN OF CARE 1. Patient evaluated today. 2. Instructed to maintain good pedal hygiene and foot care. Stressed importance of controlling blood sugar.  3. Mechanical debridement of nails 1-5 bilaterally performed using a nail nipper. Filed with dremel without incident.  4. Return to clinic in 3 mos.     Felecia Shelling, DPM [facility]  Dr. Felecia Shelling, DPM    [address]                                        [place], [place] [address]                Office [phone]  Fax [phone]

## 2021-03-26 ENCOUNTER — Ambulatory Visit: Payer: Medicare HMO | Admitting: Podiatry

## 2021-04-03 ENCOUNTER — Ambulatory Visit
Admission: RE | Admit: 2021-04-03 | Discharge: 2021-04-03 | Disposition: A | Discharge status: Home / Self Care | Payer: Medicare Other | Source: Ambulatory Visit | Attending: Family Medicine | Admitting: Family Medicine

## 2021-04-03 ENCOUNTER — Other Ambulatory Visit: Payer: Self-pay

## 2021-04-03 ENCOUNTER — Other Ambulatory Visit: Payer: Self-pay | Admitting: Family Medicine

## 2021-04-03 DIAGNOSIS — R059 Cough, unspecified: Secondary | ICD-10-CM

## 2021-04-07 ENCOUNTER — Other Ambulatory Visit: Payer: Self-pay | Admitting: Family Medicine

## 2021-04-07 DIAGNOSIS — R9389 Abnormal findings on diagnostic imaging of other specified body structures: Secondary | ICD-10-CM

## 2021-04-10 ENCOUNTER — Other Ambulatory Visit (HOSPITAL_COMMUNITY): Payer: Self-pay | Admitting: Family Medicine

## 2021-04-10 ENCOUNTER — Other Ambulatory Visit (HOSPITAL_BASED_OUTPATIENT_CLINIC_OR_DEPARTMENT_OTHER): Payer: Self-pay | Admitting: Family Medicine

## 2021-04-10 ENCOUNTER — Other Ambulatory Visit: Payer: Self-pay | Admitting: Family Medicine

## 2021-04-10 DIAGNOSIS — R059 Cough, unspecified: Secondary | ICD-10-CM

## 2021-04-10 DIAGNOSIS — R9389 Abnormal findings on diagnostic imaging of other specified body structures: Secondary | ICD-10-CM

## 2021-04-14 ENCOUNTER — Inpatient Hospital Stay: Admission: RE | Admit: 2021-04-14 | Payer: Medicare Other | Source: Ambulatory Visit

## 2021-04-14 ENCOUNTER — Other Ambulatory Visit (HOSPITAL_COMMUNITY): Payer: Self-pay | Admitting: Family Medicine

## 2021-04-14 DIAGNOSIS — R9389 Abnormal findings on diagnostic imaging of other specified body structures: Secondary | ICD-10-CM

## 2021-04-28 ENCOUNTER — Ambulatory Visit (HOSPITAL_COMMUNITY): Payer: Medicare HMO

## 2021-04-28 ENCOUNTER — Encounter (HOSPITAL_COMMUNITY): Payer: Self-pay

## 2021-05-05 ENCOUNTER — Ambulatory Visit (HOSPITAL_COMMUNITY): Admission: RE | Admit: 2021-05-05 | Payer: Medicare HMO | Source: Ambulatory Visit

## 2022-04-07 DEATH — deceased
